# Patient Record
Sex: Male | Born: 1935 | Race: White | Hispanic: No | Marital: Married | Smoking: Former smoker
Health system: Southern US, Community
[De-identification: ages and names within clinical notes are randomized; demographics above are authoritative.]

## PROBLEM LIST (undated history)

## (undated) DIAGNOSIS — I10 Essential (primary) hypertension: Secondary | ICD-10-CM

## (undated) DIAGNOSIS — E119 Type 2 diabetes mellitus without complications: Secondary | ICD-10-CM

## (undated) DIAGNOSIS — N189 Chronic kidney disease, unspecified: Secondary | ICD-10-CM

## (undated) DIAGNOSIS — F0280 Dementia in other diseases classified elsewhere without behavioral disturbance: Secondary | ICD-10-CM

## (undated) DIAGNOSIS — G309 Alzheimer's disease, unspecified: Secondary | ICD-10-CM

## (undated) DIAGNOSIS — I2510 Atherosclerotic heart disease of native coronary artery without angina pectoris: Secondary | ICD-10-CM

## (undated) DIAGNOSIS — G459 Transient cerebral ischemic attack, unspecified: Secondary | ICD-10-CM

## (undated) HISTORY — PX: ANGIOPLASTY: SHX39

## (undated) HISTORY — DX: Dementia in other diseases classified elsewhere, unspecified severity, without behavioral disturbance, psychotic disturbance, mood disturbance, and anxiety: F02.80

## (undated) HISTORY — PX: ESOPHAGEAL DILATION: SHX303

---

## 2015-07-27 DIAGNOSIS — I517 Cardiomegaly: Secondary | ICD-10-CM | POA: Insufficient documentation

## 2015-07-27 DIAGNOSIS — K219 Gastro-esophageal reflux disease without esophagitis: Secondary | ICD-10-CM | POA: Insufficient documentation

## 2015-07-27 DIAGNOSIS — E782 Mixed hyperlipidemia: Secondary | ICD-10-CM | POA: Insufficient documentation

## 2015-07-29 DIAGNOSIS — E782 Mixed hyperlipidemia: Secondary | ICD-10-CM | POA: Diagnosis not present

## 2015-07-29 DIAGNOSIS — N401 Enlarged prostate with lower urinary tract symptoms: Secondary | ICD-10-CM | POA: Diagnosis not present

## 2015-07-29 DIAGNOSIS — N183 Chronic kidney disease, stage 3 (moderate): Secondary | ICD-10-CM | POA: Diagnosis not present

## 2015-07-29 DIAGNOSIS — F319 Bipolar disorder, unspecified: Secondary | ICD-10-CM | POA: Insufficient documentation

## 2015-07-29 DIAGNOSIS — E1122 Type 2 diabetes mellitus with diabetic chronic kidney disease: Secondary | ICD-10-CM | POA: Diagnosis not present

## 2015-07-29 DIAGNOSIS — F3130 Bipolar disorder, current episode depressed, mild or moderate severity, unspecified: Secondary | ICD-10-CM | POA: Diagnosis not present

## 2015-07-29 DIAGNOSIS — I129 Hypertensive chronic kidney disease with stage 1 through stage 4 chronic kidney disease, or unspecified chronic kidney disease: Secondary | ICD-10-CM | POA: Diagnosis not present

## 2015-08-01 DIAGNOSIS — E1122 Type 2 diabetes mellitus with diabetic chronic kidney disease: Secondary | ICD-10-CM | POA: Diagnosis not present

## 2015-08-01 DIAGNOSIS — E782 Mixed hyperlipidemia: Secondary | ICD-10-CM | POA: Diagnosis not present

## 2015-08-01 DIAGNOSIS — I10 Essential (primary) hypertension: Secondary | ICD-10-CM | POA: Diagnosis not present

## 2015-08-01 DIAGNOSIS — N183 Chronic kidney disease, stage 3 (moderate): Secondary | ICD-10-CM | POA: Diagnosis not present

## 2015-08-03 DIAGNOSIS — I129 Hypertensive chronic kidney disease with stage 1 through stage 4 chronic kidney disease, or unspecified chronic kidney disease: Secondary | ICD-10-CM | POA: Diagnosis not present

## 2015-08-03 DIAGNOSIS — G5601 Carpal tunnel syndrome, right upper limb: Secondary | ICD-10-CM | POA: Diagnosis not present

## 2015-08-03 DIAGNOSIS — E782 Mixed hyperlipidemia: Secondary | ICD-10-CM | POA: Diagnosis not present

## 2015-08-03 DIAGNOSIS — N183 Chronic kidney disease, stage 3 (moderate): Secondary | ICD-10-CM | POA: Diagnosis not present

## 2015-08-03 DIAGNOSIS — E1122 Type 2 diabetes mellitus with diabetic chronic kidney disease: Secondary | ICD-10-CM | POA: Diagnosis not present

## 2015-08-24 DIAGNOSIS — G5601 Carpal tunnel syndrome, right upper limb: Secondary | ICD-10-CM | POA: Diagnosis not present

## 2015-08-29 DIAGNOSIS — H2511 Age-related nuclear cataract, right eye: Secondary | ICD-10-CM | POA: Diagnosis not present

## 2015-08-29 DIAGNOSIS — H5203 Hypermetropia, bilateral: Secondary | ICD-10-CM | POA: Diagnosis not present

## 2015-08-29 DIAGNOSIS — D2311 Other benign neoplasm of skin of right eyelid, including canthus: Secondary | ICD-10-CM | POA: Diagnosis not present

## 2015-08-29 DIAGNOSIS — H353132 Nonexudative age-related macular degeneration, bilateral, intermediate dry stage: Secondary | ICD-10-CM | POA: Diagnosis not present

## 2015-09-14 DIAGNOSIS — G5603 Carpal tunnel syndrome, bilateral upper limbs: Secondary | ICD-10-CM | POA: Diagnosis not present

## 2015-10-10 DIAGNOSIS — G5603 Carpal tunnel syndrome, bilateral upper limbs: Secondary | ICD-10-CM | POA: Diagnosis not present

## 2015-10-31 DIAGNOSIS — C4491 Basal cell carcinoma of skin, unspecified: Secondary | ICD-10-CM | POA: Diagnosis not present

## 2015-10-31 DIAGNOSIS — J180 Bronchopneumonia, unspecified organism: Secondary | ICD-10-CM | POA: Diagnosis not present

## 2015-10-31 DIAGNOSIS — J209 Acute bronchitis, unspecified: Secondary | ICD-10-CM | POA: Diagnosis not present

## 2015-10-31 DIAGNOSIS — N183 Chronic kidney disease, stage 3 (moderate): Secondary | ICD-10-CM | POA: Diagnosis not present

## 2015-10-31 DIAGNOSIS — E1122 Type 2 diabetes mellitus with diabetic chronic kidney disease: Secondary | ICD-10-CM | POA: Diagnosis not present

## 2015-10-31 DIAGNOSIS — I129 Hypertensive chronic kidney disease with stage 1 through stage 4 chronic kidney disease, or unspecified chronic kidney disease: Secondary | ICD-10-CM | POA: Diagnosis not present

## 2015-10-31 DIAGNOSIS — R05 Cough: Secondary | ICD-10-CM | POA: Diagnosis not present

## 2015-11-01 DIAGNOSIS — J9811 Atelectasis: Secondary | ICD-10-CM | POA: Diagnosis not present

## 2015-11-15 DIAGNOSIS — Z85828 Personal history of other malignant neoplasm of skin: Secondary | ICD-10-CM | POA: Diagnosis not present

## 2015-11-15 DIAGNOSIS — L570 Actinic keratosis: Secondary | ICD-10-CM | POA: Diagnosis not present

## 2015-11-15 DIAGNOSIS — Z08 Encounter for follow-up examination after completed treatment for malignant neoplasm: Secondary | ICD-10-CM | POA: Diagnosis not present

## 2015-11-15 DIAGNOSIS — L821 Other seborrheic keratosis: Secondary | ICD-10-CM | POA: Diagnosis not present

## 2015-11-15 DIAGNOSIS — L578 Other skin changes due to chronic exposure to nonionizing radiation: Secondary | ICD-10-CM | POA: Diagnosis not present

## 2015-11-28 DIAGNOSIS — J209 Acute bronchitis, unspecified: Secondary | ICD-10-CM | POA: Diagnosis not present

## 2015-12-28 DIAGNOSIS — H353132 Nonexudative age-related macular degeneration, bilateral, intermediate dry stage: Secondary | ICD-10-CM | POA: Diagnosis not present

## 2015-12-28 DIAGNOSIS — H2511 Age-related nuclear cataract, right eye: Secondary | ICD-10-CM | POA: Diagnosis not present

## 2015-12-28 DIAGNOSIS — H5203 Hypermetropia, bilateral: Secondary | ICD-10-CM | POA: Diagnosis not present

## 2015-12-28 DIAGNOSIS — H01001 Unspecified blepharitis right upper eyelid: Secondary | ICD-10-CM | POA: Diagnosis not present

## 2015-12-28 DIAGNOSIS — E119 Type 2 diabetes mellitus without complications: Secondary | ICD-10-CM | POA: Diagnosis not present

## 2015-12-28 DIAGNOSIS — Z961 Presence of intraocular lens: Secondary | ICD-10-CM | POA: Diagnosis not present

## 2016-01-27 DIAGNOSIS — N183 Chronic kidney disease, stage 3 (moderate): Secondary | ICD-10-CM | POA: Diagnosis not present

## 2016-01-27 DIAGNOSIS — E1122 Type 2 diabetes mellitus with diabetic chronic kidney disease: Secondary | ICD-10-CM | POA: Diagnosis not present

## 2016-01-27 DIAGNOSIS — F3130 Bipolar disorder, current episode depressed, mild or moderate severity, unspecified: Secondary | ICD-10-CM | POA: Diagnosis not present

## 2016-01-27 DIAGNOSIS — I129 Hypertensive chronic kidney disease with stage 1 through stage 4 chronic kidney disease, or unspecified chronic kidney disease: Secondary | ICD-10-CM | POA: Diagnosis not present

## 2016-01-27 DIAGNOSIS — Z0000 Encounter for general adult medical examination without abnormal findings: Secondary | ICD-10-CM | POA: Diagnosis not present

## 2016-02-15 DIAGNOSIS — K649 Unspecified hemorrhoids: Secondary | ICD-10-CM | POA: Diagnosis not present

## 2016-02-15 DIAGNOSIS — Z1211 Encounter for screening for malignant neoplasm of colon: Secondary | ICD-10-CM | POA: Diagnosis not present

## 2016-02-15 DIAGNOSIS — K5730 Diverticulosis of large intestine without perforation or abscess without bleeding: Secondary | ICD-10-CM | POA: Diagnosis not present

## 2016-02-15 DIAGNOSIS — Z0000 Encounter for general adult medical examination without abnormal findings: Secondary | ICD-10-CM | POA: Diagnosis not present

## 2016-02-15 DIAGNOSIS — D122 Benign neoplasm of ascending colon: Secondary | ICD-10-CM | POA: Diagnosis not present

## 2016-02-15 DIAGNOSIS — Z86010 Personal history of colonic polyps: Secondary | ICD-10-CM | POA: Diagnosis not present

## 2016-02-22 DIAGNOSIS — L570 Actinic keratosis: Secondary | ICD-10-CM | POA: Diagnosis not present

## 2016-05-17 DIAGNOSIS — L89159 Pressure ulcer of sacral region, unspecified stage: Secondary | ICD-10-CM | POA: Diagnosis not present

## 2016-05-17 DIAGNOSIS — H61001 Unspecified perichondritis of right external ear: Secondary | ICD-10-CM | POA: Diagnosis not present

## 2016-05-17 DIAGNOSIS — D485 Neoplasm of uncertain behavior of skin: Secondary | ICD-10-CM | POA: Diagnosis not present

## 2016-05-22 DIAGNOSIS — H524 Presbyopia: Secondary | ICD-10-CM | POA: Diagnosis not present

## 2016-05-22 DIAGNOSIS — H52203 Unspecified astigmatism, bilateral: Secondary | ICD-10-CM | POA: Diagnosis not present

## 2016-05-22 DIAGNOSIS — H2511 Age-related nuclear cataract, right eye: Secondary | ICD-10-CM | POA: Diagnosis not present

## 2016-05-22 DIAGNOSIS — H353132 Nonexudative age-related macular degeneration, bilateral, intermediate dry stage: Secondary | ICD-10-CM | POA: Diagnosis not present

## 2016-06-19 DIAGNOSIS — L308 Other specified dermatitis: Secondary | ICD-10-CM | POA: Diagnosis not present

## 2016-07-25 DIAGNOSIS — E782 Mixed hyperlipidemia: Secondary | ICD-10-CM | POA: Diagnosis not present

## 2016-07-25 DIAGNOSIS — N183 Chronic kidney disease, stage 3 (moderate): Secondary | ICD-10-CM | POA: Diagnosis not present

## 2016-07-25 DIAGNOSIS — E1122 Type 2 diabetes mellitus with diabetic chronic kidney disease: Secondary | ICD-10-CM | POA: Diagnosis not present

## 2016-07-25 DIAGNOSIS — F3130 Bipolar disorder, current episode depressed, mild or moderate severity, unspecified: Secondary | ICD-10-CM | POA: Diagnosis not present

## 2016-07-25 DIAGNOSIS — I129 Hypertensive chronic kidney disease with stage 1 through stage 4 chronic kidney disease, or unspecified chronic kidney disease: Secondary | ICD-10-CM | POA: Diagnosis not present

## 2016-08-07 DIAGNOSIS — N183 Chronic kidney disease, stage 3 (moderate): Secondary | ICD-10-CM | POA: Diagnosis not present

## 2016-08-07 DIAGNOSIS — E782 Mixed hyperlipidemia: Secondary | ICD-10-CM | POA: Diagnosis not present

## 2016-08-07 DIAGNOSIS — E1122 Type 2 diabetes mellitus with diabetic chronic kidney disease: Secondary | ICD-10-CM | POA: Diagnosis not present

## 2016-08-20 DIAGNOSIS — J209 Acute bronchitis, unspecified: Secondary | ICD-10-CM | POA: Diagnosis not present

## 2016-10-11 DIAGNOSIS — L309 Dermatitis, unspecified: Secondary | ICD-10-CM | POA: Diagnosis not present

## 2016-10-24 DIAGNOSIS — G5601 Carpal tunnel syndrome, right upper limb: Secondary | ICD-10-CM | POA: Diagnosis not present

## 2016-10-25 DIAGNOSIS — L309 Dermatitis, unspecified: Secondary | ICD-10-CM | POA: Diagnosis not present

## 2016-11-06 DIAGNOSIS — Z85828 Personal history of other malignant neoplasm of skin: Secondary | ICD-10-CM | POA: Diagnosis not present

## 2016-11-06 DIAGNOSIS — Z08 Encounter for follow-up examination after completed treatment for malignant neoplasm: Secondary | ICD-10-CM | POA: Diagnosis not present

## 2016-11-06 DIAGNOSIS — L570 Actinic keratosis: Secondary | ICD-10-CM | POA: Diagnosis not present

## 2016-11-06 DIAGNOSIS — H61001 Unspecified perichondritis of right external ear: Secondary | ICD-10-CM | POA: Diagnosis not present

## 2016-11-06 DIAGNOSIS — L89159 Pressure ulcer of sacral region, unspecified stage: Secondary | ICD-10-CM | POA: Diagnosis not present

## 2016-11-26 DIAGNOSIS — E119 Type 2 diabetes mellitus without complications: Secondary | ICD-10-CM | POA: Diagnosis not present

## 2016-11-26 DIAGNOSIS — Z961 Presence of intraocular lens: Secondary | ICD-10-CM | POA: Diagnosis not present

## 2016-11-26 DIAGNOSIS — H52203 Unspecified astigmatism, bilateral: Secondary | ICD-10-CM | POA: Diagnosis not present

## 2016-11-26 DIAGNOSIS — H353132 Nonexudative age-related macular degeneration, bilateral, intermediate dry stage: Secondary | ICD-10-CM | POA: Diagnosis not present

## 2016-11-26 DIAGNOSIS — H2511 Age-related nuclear cataract, right eye: Secondary | ICD-10-CM | POA: Diagnosis not present

## 2016-11-26 DIAGNOSIS — H524 Presbyopia: Secondary | ICD-10-CM | POA: Diagnosis not present

## 2017-01-24 DIAGNOSIS — N183 Chronic kidney disease, stage 3 (moderate): Secondary | ICD-10-CM | POA: Diagnosis not present

## 2017-01-24 DIAGNOSIS — I129 Hypertensive chronic kidney disease with stage 1 through stage 4 chronic kidney disease, or unspecified chronic kidney disease: Secondary | ICD-10-CM | POA: Diagnosis not present

## 2017-01-24 DIAGNOSIS — E1122 Type 2 diabetes mellitus with diabetic chronic kidney disease: Secondary | ICD-10-CM | POA: Diagnosis not present

## 2017-01-24 DIAGNOSIS — L570 Actinic keratosis: Secondary | ICD-10-CM | POA: Diagnosis not present

## 2017-02-07 DIAGNOSIS — E1122 Type 2 diabetes mellitus with diabetic chronic kidney disease: Secondary | ICD-10-CM | POA: Diagnosis not present

## 2017-02-07 DIAGNOSIS — N183 Chronic kidney disease, stage 3 (moderate): Secondary | ICD-10-CM | POA: Diagnosis not present

## 2017-02-07 DIAGNOSIS — I129 Hypertensive chronic kidney disease with stage 1 through stage 4 chronic kidney disease, or unspecified chronic kidney disease: Secondary | ICD-10-CM | POA: Diagnosis not present

## 2017-02-07 DIAGNOSIS — N281 Cyst of kidney, acquired: Secondary | ICD-10-CM | POA: Diagnosis not present

## 2017-03-01 DIAGNOSIS — L570 Actinic keratosis: Secondary | ICD-10-CM | POA: Diagnosis not present

## 2017-04-05 DIAGNOSIS — L989 Disorder of the skin and subcutaneous tissue, unspecified: Secondary | ICD-10-CM | POA: Diagnosis not present

## 2017-04-05 DIAGNOSIS — L570 Actinic keratosis: Secondary | ICD-10-CM | POA: Diagnosis not present

## 2017-04-26 DIAGNOSIS — N183 Chronic kidney disease, stage 3 (moderate): Secondary | ICD-10-CM | POA: Diagnosis not present

## 2017-04-26 DIAGNOSIS — L570 Actinic keratosis: Secondary | ICD-10-CM | POA: Diagnosis not present

## 2017-04-26 DIAGNOSIS — E1122 Type 2 diabetes mellitus with diabetic chronic kidney disease: Secondary | ICD-10-CM | POA: Diagnosis not present

## 2017-05-29 DIAGNOSIS — R1310 Dysphagia, unspecified: Secondary | ICD-10-CM | POA: Diagnosis not present

## 2017-06-04 DIAGNOSIS — K2210 Ulcer of esophagus without bleeding: Secondary | ICD-10-CM | POA: Diagnosis not present

## 2017-06-04 DIAGNOSIS — K2270 Barrett's esophagus without dysplasia: Secondary | ICD-10-CM | POA: Diagnosis not present

## 2017-06-04 DIAGNOSIS — K222 Esophageal obstruction: Secondary | ICD-10-CM | POA: Diagnosis not present

## 2017-06-04 DIAGNOSIS — K228 Other specified diseases of esophagus: Secondary | ICD-10-CM | POA: Diagnosis not present

## 2017-06-04 DIAGNOSIS — K210 Gastro-esophageal reflux disease with esophagitis: Secondary | ICD-10-CM | POA: Diagnosis not present

## 2017-06-04 DIAGNOSIS — K225 Diverticulum of esophagus, acquired: Secondary | ICD-10-CM | POA: Diagnosis not present

## 2017-08-29 DIAGNOSIS — L578 Other skin changes due to chronic exposure to nonionizing radiation: Secondary | ICD-10-CM | POA: Diagnosis not present

## 2017-08-29 DIAGNOSIS — C44222 Squamous cell carcinoma of skin of right ear and external auricular canal: Secondary | ICD-10-CM | POA: Diagnosis not present

## 2017-08-29 DIAGNOSIS — C44519 Basal cell carcinoma of skin of other part of trunk: Secondary | ICD-10-CM | POA: Diagnosis not present

## 2017-08-29 DIAGNOSIS — L0100 Impetigo, unspecified: Secondary | ICD-10-CM | POA: Diagnosis not present

## 2017-10-01 DIAGNOSIS — G5601 Carpal tunnel syndrome, right upper limb: Secondary | ICD-10-CM | POA: Diagnosis not present

## 2017-10-30 DIAGNOSIS — N401 Enlarged prostate with lower urinary tract symptoms: Secondary | ICD-10-CM | POA: Insufficient documentation

## 2017-10-30 DIAGNOSIS — E1122 Type 2 diabetes mellitus with diabetic chronic kidney disease: Secondary | ICD-10-CM | POA: Diagnosis not present

## 2017-10-30 DIAGNOSIS — I129 Hypertensive chronic kidney disease with stage 1 through stage 4 chronic kidney disease, or unspecified chronic kidney disease: Secondary | ICD-10-CM | POA: Diagnosis not present

## 2017-10-30 DIAGNOSIS — E782 Mixed hyperlipidemia: Secondary | ICD-10-CM | POA: Diagnosis not present

## 2017-10-30 DIAGNOSIS — N183 Chronic kidney disease, stage 3 (moderate): Secondary | ICD-10-CM | POA: Diagnosis not present

## 2017-11-06 DIAGNOSIS — I129 Hypertensive chronic kidney disease with stage 1 through stage 4 chronic kidney disease, or unspecified chronic kidney disease: Secondary | ICD-10-CM | POA: Diagnosis not present

## 2017-11-06 DIAGNOSIS — N179 Acute kidney failure, unspecified: Secondary | ICD-10-CM | POA: Diagnosis not present

## 2017-11-06 DIAGNOSIS — E1122 Type 2 diabetes mellitus with diabetic chronic kidney disease: Secondary | ICD-10-CM | POA: Diagnosis not present

## 2017-11-06 DIAGNOSIS — N183 Chronic kidney disease, stage 3 (moderate): Secondary | ICD-10-CM | POA: Diagnosis not present

## 2018-01-04 DIAGNOSIS — N179 Acute kidney failure, unspecified: Secondary | ICD-10-CM | POA: Diagnosis not present

## 2018-01-04 DIAGNOSIS — N183 Chronic kidney disease, stage 3 (moderate): Secondary | ICD-10-CM | POA: Diagnosis not present

## 2018-01-07 DIAGNOSIS — I129 Hypertensive chronic kidney disease with stage 1 through stage 4 chronic kidney disease, or unspecified chronic kidney disease: Secondary | ICD-10-CM | POA: Diagnosis not present

## 2018-01-07 DIAGNOSIS — N179 Acute kidney failure, unspecified: Secondary | ICD-10-CM | POA: Diagnosis not present

## 2018-01-07 DIAGNOSIS — N183 Chronic kidney disease, stage 3 (moderate): Secondary | ICD-10-CM | POA: Diagnosis not present

## 2018-01-07 DIAGNOSIS — E1122 Type 2 diabetes mellitus with diabetic chronic kidney disease: Secondary | ICD-10-CM | POA: Diagnosis not present

## 2018-03-26 DIAGNOSIS — L570 Actinic keratosis: Secondary | ICD-10-CM | POA: Diagnosis not present

## 2018-03-26 DIAGNOSIS — L280 Lichen simplex chronicus: Secondary | ICD-10-CM | POA: Diagnosis not present

## 2018-03-26 DIAGNOSIS — H61001 Unspecified perichondritis of right external ear: Secondary | ICD-10-CM | POA: Diagnosis not present

## 2018-04-23 DIAGNOSIS — H61001 Unspecified perichondritis of right external ear: Secondary | ICD-10-CM | POA: Diagnosis not present

## 2018-04-25 DIAGNOSIS — G5601 Carpal tunnel syndrome, right upper limb: Secondary | ICD-10-CM | POA: Diagnosis not present

## 2018-04-30 DIAGNOSIS — E1122 Type 2 diabetes mellitus with diabetic chronic kidney disease: Secondary | ICD-10-CM | POA: Diagnosis not present

## 2018-04-30 DIAGNOSIS — I129 Hypertensive chronic kidney disease with stage 1 through stage 4 chronic kidney disease, or unspecified chronic kidney disease: Secondary | ICD-10-CM | POA: Diagnosis not present

## 2018-04-30 DIAGNOSIS — N183 Chronic kidney disease, stage 3 (moderate): Secondary | ICD-10-CM | POA: Diagnosis not present

## 2018-05-05 DIAGNOSIS — N183 Chronic kidney disease, stage 3 (moderate): Secondary | ICD-10-CM | POA: Diagnosis not present

## 2018-05-05 DIAGNOSIS — Z0000 Encounter for general adult medical examination without abnormal findings: Secondary | ICD-10-CM | POA: Diagnosis not present

## 2018-05-05 DIAGNOSIS — L89812 Pressure ulcer of head, stage 2: Secondary | ICD-10-CM | POA: Diagnosis not present

## 2018-05-05 DIAGNOSIS — E782 Mixed hyperlipidemia: Secondary | ICD-10-CM | POA: Diagnosis not present

## 2018-05-05 DIAGNOSIS — E1122 Type 2 diabetes mellitus with diabetic chronic kidney disease: Secondary | ICD-10-CM | POA: Diagnosis not present

## 2018-05-05 DIAGNOSIS — Z23 Encounter for immunization: Secondary | ICD-10-CM | POA: Diagnosis not present

## 2018-05-05 DIAGNOSIS — Z7984 Long term (current) use of oral hypoglycemic drugs: Secondary | ICD-10-CM | POA: Diagnosis not present

## 2018-05-05 DIAGNOSIS — F319 Bipolar disorder, unspecified: Secondary | ICD-10-CM | POA: Diagnosis not present

## 2018-05-05 DIAGNOSIS — I129 Hypertensive chronic kidney disease with stage 1 through stage 4 chronic kidney disease, or unspecified chronic kidney disease: Secondary | ICD-10-CM | POA: Diagnosis not present

## 2018-05-14 DIAGNOSIS — H61001 Unspecified perichondritis of right external ear: Secondary | ICD-10-CM | POA: Diagnosis not present

## 2018-10-09 DIAGNOSIS — E1122 Type 2 diabetes mellitus with diabetic chronic kidney disease: Secondary | ICD-10-CM | POA: Diagnosis not present

## 2018-10-09 DIAGNOSIS — N183 Chronic kidney disease, stage 3 (moderate): Secondary | ICD-10-CM | POA: Diagnosis not present

## 2018-10-09 DIAGNOSIS — G5603 Carpal tunnel syndrome, bilateral upper limbs: Secondary | ICD-10-CM | POA: Diagnosis not present

## 2018-10-09 DIAGNOSIS — Z87891 Personal history of nicotine dependence: Secondary | ICD-10-CM | POA: Diagnosis not present

## 2018-10-09 DIAGNOSIS — I129 Hypertensive chronic kidney disease with stage 1 through stage 4 chronic kidney disease, or unspecified chronic kidney disease: Secondary | ICD-10-CM | POA: Diagnosis not present

## 2018-10-09 DIAGNOSIS — M7052 Other bursitis of knee, left knee: Secondary | ICD-10-CM | POA: Diagnosis not present

## 2018-11-07 DIAGNOSIS — G5601 Carpal tunnel syndrome, right upper limb: Secondary | ICD-10-CM | POA: Diagnosis not present

## 2018-11-17 DIAGNOSIS — H60392 Other infective otitis externa, left ear: Secondary | ICD-10-CM | POA: Diagnosis not present

## 2018-11-17 DIAGNOSIS — F319 Bipolar disorder, unspecified: Secondary | ICD-10-CM | POA: Diagnosis not present

## 2018-11-17 DIAGNOSIS — H9202 Otalgia, left ear: Secondary | ICD-10-CM | POA: Diagnosis not present

## 2018-11-24 DIAGNOSIS — H60312 Diffuse otitis externa, left ear: Secondary | ICD-10-CM | POA: Diagnosis not present

## 2018-11-24 DIAGNOSIS — Z87891 Personal history of nicotine dependence: Secondary | ICD-10-CM | POA: Diagnosis not present

## 2018-11-24 DIAGNOSIS — Z974 Presence of external hearing-aid: Secondary | ICD-10-CM | POA: Diagnosis not present

## 2018-11-24 DIAGNOSIS — Z7289 Other problems related to lifestyle: Secondary | ICD-10-CM | POA: Diagnosis not present

## 2018-11-24 DIAGNOSIS — T162XXA Foreign body in left ear, initial encounter: Secondary | ICD-10-CM | POA: Diagnosis not present

## 2019-01-06 DIAGNOSIS — Z0000 Encounter for general adult medical examination without abnormal findings: Secondary | ICD-10-CM | POA: Diagnosis not present

## 2019-01-06 DIAGNOSIS — E1122 Type 2 diabetes mellitus with diabetic chronic kidney disease: Secondary | ICD-10-CM | POA: Diagnosis not present

## 2019-01-06 DIAGNOSIS — N183 Chronic kidney disease, stage 3 (moderate): Secondary | ICD-10-CM | POA: Diagnosis not present

## 2019-01-08 DIAGNOSIS — N183 Chronic kidney disease, stage 3 (moderate): Secondary | ICD-10-CM | POA: Diagnosis not present

## 2019-01-08 DIAGNOSIS — L309 Dermatitis, unspecified: Secondary | ICD-10-CM | POA: Diagnosis not present

## 2019-01-08 DIAGNOSIS — I129 Hypertensive chronic kidney disease with stage 1 through stage 4 chronic kidney disease, or unspecified chronic kidney disease: Secondary | ICD-10-CM | POA: Diagnosis not present

## 2019-01-08 DIAGNOSIS — E1122 Type 2 diabetes mellitus with diabetic chronic kidney disease: Secondary | ICD-10-CM | POA: Diagnosis not present

## 2019-03-24 DIAGNOSIS — E1122 Type 2 diabetes mellitus with diabetic chronic kidney disease: Secondary | ICD-10-CM | POA: Diagnosis not present

## 2019-03-24 DIAGNOSIS — N183 Chronic kidney disease, stage 3 (moderate): Secondary | ICD-10-CM | POA: Diagnosis not present

## 2019-03-24 DIAGNOSIS — I129 Hypertensive chronic kidney disease with stage 1 through stage 4 chronic kidney disease, or unspecified chronic kidney disease: Secondary | ICD-10-CM | POA: Diagnosis not present

## 2019-03-24 DIAGNOSIS — Z87891 Personal history of nicotine dependence: Secondary | ICD-10-CM | POA: Diagnosis not present

## 2019-03-24 DIAGNOSIS — E782 Mixed hyperlipidemia: Secondary | ICD-10-CM | POA: Diagnosis not present

## 2019-03-24 DIAGNOSIS — Z0000 Encounter for general adult medical examination without abnormal findings: Secondary | ICD-10-CM | POA: Diagnosis not present

## 2019-03-24 DIAGNOSIS — N401 Enlarged prostate with lower urinary tract symptoms: Secondary | ICD-10-CM | POA: Diagnosis not present

## 2019-04-06 DIAGNOSIS — Z23 Encounter for immunization: Secondary | ICD-10-CM | POA: Diagnosis not present

## 2019-04-06 DIAGNOSIS — E1122 Type 2 diabetes mellitus with diabetic chronic kidney disease: Secondary | ICD-10-CM | POA: Diagnosis not present

## 2019-04-06 DIAGNOSIS — J069 Acute upper respiratory infection, unspecified: Secondary | ICD-10-CM | POA: Diagnosis not present

## 2019-04-06 DIAGNOSIS — U071 COVID-19: Secondary | ICD-10-CM | POA: Diagnosis not present

## 2019-04-06 DIAGNOSIS — N183 Chronic kidney disease, stage 3 (moderate): Secondary | ICD-10-CM | POA: Diagnosis not present

## 2019-05-25 DIAGNOSIS — G5601 Carpal tunnel syndrome, right upper limb: Secondary | ICD-10-CM | POA: Diagnosis not present

## 2019-06-12 DIAGNOSIS — H61001 Unspecified perichondritis of right external ear: Secondary | ICD-10-CM | POA: Diagnosis not present

## 2019-06-12 DIAGNOSIS — L309 Dermatitis, unspecified: Secondary | ICD-10-CM | POA: Diagnosis not present

## 2019-06-15 DIAGNOSIS — G5603 Carpal tunnel syndrome, bilateral upper limbs: Secondary | ICD-10-CM | POA: Diagnosis not present

## 2019-07-17 DIAGNOSIS — G5603 Carpal tunnel syndrome, bilateral upper limbs: Secondary | ICD-10-CM | POA: Diagnosis not present

## 2019-07-29 DIAGNOSIS — L280 Lichen simplex chronicus: Secondary | ICD-10-CM | POA: Diagnosis not present

## 2019-07-29 DIAGNOSIS — L0100 Impetigo, unspecified: Secondary | ICD-10-CM | POA: Diagnosis not present

## 2019-08-14 DIAGNOSIS — R200 Anesthesia of skin: Secondary | ICD-10-CM | POA: Diagnosis not present

## 2019-08-14 DIAGNOSIS — G5601 Carpal tunnel syndrome, right upper limb: Secondary | ICD-10-CM | POA: Diagnosis not present

## 2019-08-31 DIAGNOSIS — G5601 Carpal tunnel syndrome, right upper limb: Secondary | ICD-10-CM | POA: Diagnosis not present

## 2019-09-07 DIAGNOSIS — N183 Chronic kidney disease, stage 3 unspecified: Secondary | ICD-10-CM | POA: Diagnosis not present

## 2019-09-07 DIAGNOSIS — Z20822 Contact with and (suspected) exposure to covid-19: Secondary | ICD-10-CM | POA: Diagnosis not present

## 2019-09-07 DIAGNOSIS — I129 Hypertensive chronic kidney disease with stage 1 through stage 4 chronic kidney disease, or unspecified chronic kidney disease: Secondary | ICD-10-CM | POA: Diagnosis not present

## 2019-09-07 DIAGNOSIS — E1122 Type 2 diabetes mellitus with diabetic chronic kidney disease: Secondary | ICD-10-CM | POA: Diagnosis not present

## 2019-09-07 DIAGNOSIS — G5601 Carpal tunnel syndrome, right upper limb: Secondary | ICD-10-CM | POA: Diagnosis not present

## 2019-09-07 DIAGNOSIS — I2510 Atherosclerotic heart disease of native coronary artery without angina pectoris: Secondary | ICD-10-CM | POA: Diagnosis not present

## 2019-09-07 DIAGNOSIS — Z01812 Encounter for preprocedural laboratory examination: Secondary | ICD-10-CM | POA: Diagnosis not present

## 2019-09-10 DIAGNOSIS — G5601 Carpal tunnel syndrome, right upper limb: Secondary | ICD-10-CM | POA: Diagnosis not present

## 2019-09-21 DIAGNOSIS — N183 Chronic kidney disease, stage 3 unspecified: Secondary | ICD-10-CM | POA: Diagnosis not present

## 2019-09-21 DIAGNOSIS — I129 Hypertensive chronic kidney disease with stage 1 through stage 4 chronic kidney disease, or unspecified chronic kidney disease: Secondary | ICD-10-CM | POA: Diagnosis not present

## 2019-09-21 DIAGNOSIS — R3912 Poor urinary stream: Secondary | ICD-10-CM | POA: Diagnosis not present

## 2019-09-21 DIAGNOSIS — N401 Enlarged prostate with lower urinary tract symptoms: Secondary | ICD-10-CM | POA: Diagnosis not present

## 2019-09-21 DIAGNOSIS — F319 Bipolar disorder, unspecified: Secondary | ICD-10-CM | POA: Diagnosis not present

## 2019-09-21 DIAGNOSIS — G5603 Carpal tunnel syndrome, bilateral upper limbs: Secondary | ICD-10-CM | POA: Diagnosis not present

## 2019-09-21 DIAGNOSIS — E782 Mixed hyperlipidemia: Secondary | ICD-10-CM | POA: Diagnosis not present

## 2019-09-21 DIAGNOSIS — E1122 Type 2 diabetes mellitus with diabetic chronic kidney disease: Secondary | ICD-10-CM | POA: Diagnosis not present

## 2019-10-06 DIAGNOSIS — E1122 Type 2 diabetes mellitus with diabetic chronic kidney disease: Secondary | ICD-10-CM | POA: Diagnosis not present

## 2019-10-06 DIAGNOSIS — I129 Hypertensive chronic kidney disease with stage 1 through stage 4 chronic kidney disease, or unspecified chronic kidney disease: Secondary | ICD-10-CM | POA: Diagnosis not present

## 2019-10-06 DIAGNOSIS — Z8616 Personal history of COVID-19: Secondary | ICD-10-CM | POA: Diagnosis not present

## 2019-10-06 DIAGNOSIS — N1832 Chronic kidney disease, stage 3b: Secondary | ICD-10-CM | POA: Diagnosis not present

## 2019-10-06 DIAGNOSIS — N261 Atrophy of kidney (terminal): Secondary | ICD-10-CM | POA: Diagnosis not present

## 2019-10-06 DIAGNOSIS — N179 Acute kidney failure, unspecified: Secondary | ICD-10-CM | POA: Diagnosis not present

## 2019-10-06 DIAGNOSIS — N183 Chronic kidney disease, stage 3 unspecified: Secondary | ICD-10-CM | POA: Diagnosis not present

## 2019-10-23 DIAGNOSIS — R55 Syncope and collapse: Secondary | ICD-10-CM | POA: Diagnosis not present

## 2019-10-23 DIAGNOSIS — R42 Dizziness and giddiness: Secondary | ICD-10-CM | POA: Diagnosis not present

## 2019-10-23 DIAGNOSIS — R4020 Unspecified coma: Secondary | ICD-10-CM | POA: Diagnosis not present

## 2019-10-23 DIAGNOSIS — I440 Atrioventricular block, first degree: Secondary | ICD-10-CM | POA: Diagnosis not present

## 2019-10-23 DIAGNOSIS — Z87891 Personal history of nicotine dependence: Secondary | ICD-10-CM | POA: Diagnosis not present

## 2019-10-23 DIAGNOSIS — Z87448 Personal history of other diseases of urinary system: Secondary | ICD-10-CM | POA: Diagnosis not present

## 2019-10-23 DIAGNOSIS — I493 Ventricular premature depolarization: Secondary | ICD-10-CM | POA: Diagnosis not present

## 2019-10-23 DIAGNOSIS — Z8679 Personal history of other diseases of the circulatory system: Secondary | ICD-10-CM | POA: Diagnosis not present

## 2019-10-23 DIAGNOSIS — Z8639 Personal history of other endocrine, nutritional and metabolic disease: Secondary | ICD-10-CM | POA: Diagnosis not present

## 2019-10-23 DIAGNOSIS — R001 Bradycardia, unspecified: Secondary | ICD-10-CM | POA: Diagnosis not present

## 2019-10-30 DIAGNOSIS — R42 Dizziness and giddiness: Secondary | ICD-10-CM | POA: Diagnosis not present

## 2019-10-30 DIAGNOSIS — R55 Syncope and collapse: Secondary | ICD-10-CM | POA: Diagnosis not present

## 2019-10-30 DIAGNOSIS — I129 Hypertensive chronic kidney disease with stage 1 through stage 4 chronic kidney disease, or unspecified chronic kidney disease: Secondary | ICD-10-CM | POA: Diagnosis not present

## 2019-10-30 DIAGNOSIS — N183 Chronic kidney disease, stage 3 unspecified: Secondary | ICD-10-CM | POA: Diagnosis not present

## 2019-10-30 DIAGNOSIS — E1122 Type 2 diabetes mellitus with diabetic chronic kidney disease: Secondary | ICD-10-CM | POA: Diagnosis not present

## 2019-11-20 DIAGNOSIS — R55 Syncope and collapse: Secondary | ICD-10-CM | POA: Diagnosis not present

## 2019-11-20 DIAGNOSIS — I129 Hypertensive chronic kidney disease with stage 1 through stage 4 chronic kidney disease, or unspecified chronic kidney disease: Secondary | ICD-10-CM | POA: Diagnosis not present

## 2019-11-20 DIAGNOSIS — N183 Chronic kidney disease, stage 3 unspecified: Secondary | ICD-10-CM | POA: Diagnosis not present

## 2019-11-20 DIAGNOSIS — R42 Dizziness and giddiness: Secondary | ICD-10-CM | POA: Diagnosis not present

## 2019-11-20 DIAGNOSIS — E1122 Type 2 diabetes mellitus with diabetic chronic kidney disease: Secondary | ICD-10-CM | POA: Diagnosis not present

## 2019-12-10 DIAGNOSIS — N1832 Chronic kidney disease, stage 3b: Secondary | ICD-10-CM | POA: Diagnosis not present

## 2019-12-15 DIAGNOSIS — N261 Atrophy of kidney (terminal): Secondary | ICD-10-CM | POA: Diagnosis not present

## 2019-12-15 DIAGNOSIS — E1122 Type 2 diabetes mellitus with diabetic chronic kidney disease: Secondary | ICD-10-CM | POA: Diagnosis not present

## 2019-12-15 DIAGNOSIS — N183 Chronic kidney disease, stage 3 unspecified: Secondary | ICD-10-CM | POA: Diagnosis not present

## 2019-12-15 DIAGNOSIS — N179 Acute kidney failure, unspecified: Secondary | ICD-10-CM | POA: Diagnosis not present

## 2019-12-15 DIAGNOSIS — I129 Hypertensive chronic kidney disease with stage 1 through stage 4 chronic kidney disease, or unspecified chronic kidney disease: Secondary | ICD-10-CM | POA: Diagnosis not present

## 2019-12-15 DIAGNOSIS — N1832 Chronic kidney disease, stage 3b: Secondary | ICD-10-CM | POA: Diagnosis not present

## 2019-12-18 DIAGNOSIS — I129 Hypertensive chronic kidney disease with stage 1 through stage 4 chronic kidney disease, or unspecified chronic kidney disease: Secondary | ICD-10-CM | POA: Diagnosis not present

## 2019-12-18 DIAGNOSIS — I517 Cardiomegaly: Secondary | ICD-10-CM | POA: Diagnosis not present

## 2019-12-18 DIAGNOSIS — N183 Chronic kidney disease, stage 3 unspecified: Secondary | ICD-10-CM | POA: Diagnosis not present

## 2019-12-18 DIAGNOSIS — R001 Bradycardia, unspecified: Secondary | ICD-10-CM | POA: Insufficient documentation

## 2019-12-18 DIAGNOSIS — R55 Syncope and collapse: Secondary | ICD-10-CM | POA: Insufficient documentation

## 2019-12-18 DIAGNOSIS — N1832 Chronic kidney disease, stage 3b: Secondary | ICD-10-CM | POA: Diagnosis not present

## 2019-12-30 DIAGNOSIS — N183 Chronic kidney disease, stage 3 unspecified: Secondary | ICD-10-CM | POA: Diagnosis not present

## 2019-12-30 DIAGNOSIS — I129 Hypertensive chronic kidney disease with stage 1 through stage 4 chronic kidney disease, or unspecified chronic kidney disease: Secondary | ICD-10-CM | POA: Diagnosis not present

## 2019-12-30 DIAGNOSIS — E1122 Type 2 diabetes mellitus with diabetic chronic kidney disease: Secondary | ICD-10-CM | POA: Diagnosis not present

## 2019-12-30 DIAGNOSIS — E782 Mixed hyperlipidemia: Secondary | ICD-10-CM | POA: Diagnosis not present

## 2019-12-30 DIAGNOSIS — N1832 Chronic kidney disease, stage 3b: Secondary | ICD-10-CM | POA: Diagnosis not present

## 2020-01-01 DIAGNOSIS — I129 Hypertensive chronic kidney disease with stage 1 through stage 4 chronic kidney disease, or unspecified chronic kidney disease: Secondary | ICD-10-CM | POA: Diagnosis not present

## 2020-01-01 DIAGNOSIS — L309 Dermatitis, unspecified: Secondary | ICD-10-CM | POA: Diagnosis not present

## 2020-01-01 DIAGNOSIS — E782 Mixed hyperlipidemia: Secondary | ICD-10-CM | POA: Diagnosis not present

## 2020-01-01 DIAGNOSIS — N183 Chronic kidney disease, stage 3 unspecified: Secondary | ICD-10-CM | POA: Diagnosis not present

## 2020-01-01 DIAGNOSIS — E1122 Type 2 diabetes mellitus with diabetic chronic kidney disease: Secondary | ICD-10-CM | POA: Diagnosis not present

## 2020-01-08 DIAGNOSIS — R55 Syncope and collapse: Secondary | ICD-10-CM | POA: Diagnosis not present

## 2020-01-08 DIAGNOSIS — I7789 Other specified disorders of arteries and arterioles: Secondary | ICD-10-CM | POA: Diagnosis not present

## 2020-01-08 DIAGNOSIS — I771 Stricture of artery: Secondary | ICD-10-CM | POA: Diagnosis not present

## 2020-01-08 DIAGNOSIS — I739 Peripheral vascular disease, unspecified: Secondary | ICD-10-CM | POA: Diagnosis not present

## 2020-01-13 DIAGNOSIS — I350 Nonrheumatic aortic (valve) stenosis: Secondary | ICD-10-CM | POA: Diagnosis not present

## 2020-01-13 DIAGNOSIS — I358 Other nonrheumatic aortic valve disorders: Secondary | ICD-10-CM | POA: Diagnosis not present

## 2020-01-22 DIAGNOSIS — R001 Bradycardia, unspecified: Secondary | ICD-10-CM | POA: Diagnosis not present

## 2020-01-22 DIAGNOSIS — R55 Syncope and collapse: Secondary | ICD-10-CM | POA: Diagnosis not present

## 2020-01-26 DIAGNOSIS — I493 Ventricular premature depolarization: Secondary | ICD-10-CM | POA: Diagnosis not present

## 2020-01-26 DIAGNOSIS — I471 Supraventricular tachycardia: Secondary | ICD-10-CM | POA: Diagnosis not present

## 2020-01-26 DIAGNOSIS — I441 Atrioventricular block, second degree: Secondary | ICD-10-CM | POA: Diagnosis not present

## 2020-02-02 DIAGNOSIS — R001 Bradycardia, unspecified: Secondary | ICD-10-CM | POA: Diagnosis not present

## 2020-02-02 DIAGNOSIS — N183 Chronic kidney disease, stage 3 unspecified: Secondary | ICD-10-CM | POA: Diagnosis not present

## 2020-02-02 DIAGNOSIS — E1122 Type 2 diabetes mellitus with diabetic chronic kidney disease: Secondary | ICD-10-CM | POA: Diagnosis not present

## 2020-02-24 DIAGNOSIS — H2511 Age-related nuclear cataract, right eye: Secondary | ICD-10-CM | POA: Diagnosis not present

## 2020-02-24 DIAGNOSIS — Z961 Presence of intraocular lens: Secondary | ICD-10-CM | POA: Diagnosis not present

## 2020-02-24 DIAGNOSIS — I10 Essential (primary) hypertension: Secondary | ICD-10-CM | POA: Diagnosis not present

## 2020-02-24 DIAGNOSIS — H353231 Exudative age-related macular degeneration, bilateral, with active choroidal neovascularization: Secondary | ICD-10-CM | POA: Diagnosis not present

## 2020-02-26 DIAGNOSIS — H353221 Exudative age-related macular degeneration, left eye, with active choroidal neovascularization: Secondary | ICD-10-CM | POA: Diagnosis not present

## 2020-02-26 DIAGNOSIS — H35373 Puckering of macula, bilateral: Secondary | ICD-10-CM | POA: Diagnosis not present

## 2020-02-26 DIAGNOSIS — D3132 Benign neoplasm of left choroid: Secondary | ICD-10-CM | POA: Diagnosis not present

## 2020-02-26 DIAGNOSIS — H353113 Nonexudative age-related macular degeneration, right eye, advanced atrophic without subfoveal involvement: Secondary | ICD-10-CM | POA: Diagnosis not present

## 2020-03-16 DIAGNOSIS — N261 Atrophy of kidney (terminal): Secondary | ICD-10-CM | POA: Diagnosis not present

## 2020-03-16 DIAGNOSIS — N1832 Chronic kidney disease, stage 3b: Secondary | ICD-10-CM | POA: Diagnosis not present

## 2020-03-16 DIAGNOSIS — I129 Hypertensive chronic kidney disease with stage 1 through stage 4 chronic kidney disease, or unspecified chronic kidney disease: Secondary | ICD-10-CM | POA: Diagnosis not present

## 2020-03-16 DIAGNOSIS — E1122 Type 2 diabetes mellitus with diabetic chronic kidney disease: Secondary | ICD-10-CM | POA: Diagnosis not present

## 2020-03-16 DIAGNOSIS — N183 Chronic kidney disease, stage 3 unspecified: Secondary | ICD-10-CM | POA: Diagnosis not present

## 2020-03-21 DIAGNOSIS — D3132 Benign neoplasm of left choroid: Secondary | ICD-10-CM | POA: Diagnosis not present

## 2020-03-21 DIAGNOSIS — H25811 Combined forms of age-related cataract, right eye: Secondary | ICD-10-CM | POA: Diagnosis not present

## 2020-03-21 DIAGNOSIS — H353221 Exudative age-related macular degeneration, left eye, with active choroidal neovascularization: Secondary | ICD-10-CM | POA: Diagnosis not present

## 2020-03-21 DIAGNOSIS — E119 Type 2 diabetes mellitus without complications: Secondary | ICD-10-CM | POA: Diagnosis not present

## 2020-03-21 DIAGNOSIS — Z961 Presence of intraocular lens: Secondary | ICD-10-CM | POA: Diagnosis not present

## 2020-03-21 DIAGNOSIS — H353112 Nonexudative age-related macular degeneration, right eye, intermediate dry stage: Secondary | ICD-10-CM | POA: Diagnosis not present

## 2020-04-01 DIAGNOSIS — Z23 Encounter for immunization: Secondary | ICD-10-CM | POA: Diagnosis not present

## 2020-04-01 DIAGNOSIS — E1122 Type 2 diabetes mellitus with diabetic chronic kidney disease: Secondary | ICD-10-CM | POA: Diagnosis not present

## 2020-04-01 DIAGNOSIS — N183 Chronic kidney disease, stage 3 unspecified: Secondary | ICD-10-CM | POA: Diagnosis not present

## 2020-08-01 DIAGNOSIS — E1122 Type 2 diabetes mellitus with diabetic chronic kidney disease: Secondary | ICD-10-CM | POA: Diagnosis not present

## 2020-08-01 DIAGNOSIS — Z0000 Encounter for general adult medical examination without abnormal findings: Secondary | ICD-10-CM | POA: Diagnosis not present

## 2020-08-01 DIAGNOSIS — F319 Bipolar disorder, unspecified: Secondary | ICD-10-CM | POA: Diagnosis not present

## 2020-08-01 DIAGNOSIS — Z79899 Other long term (current) drug therapy: Secondary | ICD-10-CM | POA: Diagnosis not present

## 2020-08-01 DIAGNOSIS — I129 Hypertensive chronic kidney disease with stage 1 through stage 4 chronic kidney disease, or unspecified chronic kidney disease: Secondary | ICD-10-CM | POA: Diagnosis not present

## 2020-08-01 DIAGNOSIS — Z7982 Long term (current) use of aspirin: Secondary | ICD-10-CM | POA: Diagnosis not present

## 2020-08-01 DIAGNOSIS — E782 Mixed hyperlipidemia: Secondary | ICD-10-CM | POA: Diagnosis not present

## 2020-08-01 DIAGNOSIS — N183 Chronic kidney disease, stage 3 unspecified: Secondary | ICD-10-CM | POA: Diagnosis not present

## 2020-08-08 DIAGNOSIS — N1832 Chronic kidney disease, stage 3b: Secondary | ICD-10-CM | POA: Diagnosis not present

## 2020-08-11 DIAGNOSIS — N1832 Chronic kidney disease, stage 3b: Secondary | ICD-10-CM | POA: Diagnosis not present

## 2020-08-11 DIAGNOSIS — I129 Hypertensive chronic kidney disease with stage 1 through stage 4 chronic kidney disease, or unspecified chronic kidney disease: Secondary | ICD-10-CM | POA: Diagnosis not present

## 2020-08-11 DIAGNOSIS — E1122 Type 2 diabetes mellitus with diabetic chronic kidney disease: Secondary | ICD-10-CM | POA: Diagnosis not present

## 2020-08-11 DIAGNOSIS — Z87891 Personal history of nicotine dependence: Secondary | ICD-10-CM | POA: Diagnosis not present

## 2020-08-11 DIAGNOSIS — Z7984 Long term (current) use of oral hypoglycemic drugs: Secondary | ICD-10-CM | POA: Diagnosis not present

## 2020-09-30 DIAGNOSIS — G5601 Carpal tunnel syndrome, right upper limb: Secondary | ICD-10-CM | POA: Diagnosis not present

## 2020-09-30 DIAGNOSIS — M65321 Trigger finger, right index finger: Secondary | ICD-10-CM | POA: Diagnosis not present

## 2020-12-12 DIAGNOSIS — N1832 Chronic kidney disease, stage 3b: Secondary | ICD-10-CM | POA: Diagnosis not present

## 2020-12-15 DIAGNOSIS — N1832 Chronic kidney disease, stage 3b: Secondary | ICD-10-CM | POA: Diagnosis not present

## 2020-12-15 DIAGNOSIS — I129 Hypertensive chronic kidney disease with stage 1 through stage 4 chronic kidney disease, or unspecified chronic kidney disease: Secondary | ICD-10-CM | POA: Diagnosis not present

## 2020-12-15 DIAGNOSIS — N183 Chronic kidney disease, stage 3 unspecified: Secondary | ICD-10-CM | POA: Diagnosis not present

## 2020-12-15 DIAGNOSIS — E1122 Type 2 diabetes mellitus with diabetic chronic kidney disease: Secondary | ICD-10-CM | POA: Diagnosis not present

## 2021-01-30 DIAGNOSIS — N1832 Chronic kidney disease, stage 3b: Secondary | ICD-10-CM | POA: Diagnosis not present

## 2021-01-30 DIAGNOSIS — L570 Actinic keratosis: Secondary | ICD-10-CM | POA: Diagnosis not present

## 2021-01-30 DIAGNOSIS — N183 Chronic kidney disease, stage 3 unspecified: Secondary | ICD-10-CM | POA: Diagnosis not present

## 2021-01-30 DIAGNOSIS — I129 Hypertensive chronic kidney disease with stage 1 through stage 4 chronic kidney disease, or unspecified chronic kidney disease: Secondary | ICD-10-CM | POA: Diagnosis not present

## 2021-01-30 DIAGNOSIS — Z7984 Long term (current) use of oral hypoglycemic drugs: Secondary | ICD-10-CM | POA: Diagnosis not present

## 2021-01-30 DIAGNOSIS — R413 Other amnesia: Secondary | ICD-10-CM | POA: Diagnosis not present

## 2021-01-30 DIAGNOSIS — F319 Bipolar disorder, unspecified: Secondary | ICD-10-CM | POA: Diagnosis not present

## 2021-01-30 DIAGNOSIS — E782 Mixed hyperlipidemia: Secondary | ICD-10-CM | POA: Diagnosis not present

## 2021-01-30 DIAGNOSIS — E1122 Type 2 diabetes mellitus with diabetic chronic kidney disease: Secondary | ICD-10-CM | POA: Diagnosis not present

## 2021-03-02 DIAGNOSIS — E538 Deficiency of other specified B group vitamins: Secondary | ICD-10-CM | POA: Diagnosis not present

## 2021-03-20 DIAGNOSIS — Z23 Encounter for immunization: Secondary | ICD-10-CM | POA: Diagnosis not present

## 2021-03-20 DIAGNOSIS — R413 Other amnesia: Secondary | ICD-10-CM | POA: Diagnosis not present

## 2021-03-20 DIAGNOSIS — M62838 Other muscle spasm: Secondary | ICD-10-CM | POA: Diagnosis not present

## 2021-04-29 ENCOUNTER — Emergency Department (HOSPITAL_BASED_OUTPATIENT_CLINIC_OR_DEPARTMENT_OTHER)
Admission: EM | Admit: 2021-04-29 | Discharge: 2021-04-29 | Disposition: A | Discharge status: Home / Self Care | Payer: PPO | Attending: Emergency Medicine | Admitting: Emergency Medicine

## 2021-04-29 ENCOUNTER — Emergency Department (HOSPITAL_BASED_OUTPATIENT_CLINIC_OR_DEPARTMENT_OTHER): Payer: PPO

## 2021-04-29 ENCOUNTER — Other Ambulatory Visit: Payer: Self-pay

## 2021-04-29 ENCOUNTER — Encounter (HOSPITAL_BASED_OUTPATIENT_CLINIC_OR_DEPARTMENT_OTHER): Payer: Self-pay | Admitting: Emergency Medicine

## 2021-04-29 DIAGNOSIS — Z87891 Personal history of nicotine dependence: Secondary | ICD-10-CM | POA: Insufficient documentation

## 2021-04-29 DIAGNOSIS — R109 Unspecified abdominal pain: Secondary | ICD-10-CM | POA: Diagnosis not present

## 2021-04-29 DIAGNOSIS — E119 Type 2 diabetes mellitus without complications: Secondary | ICD-10-CM | POA: Diagnosis not present

## 2021-04-29 DIAGNOSIS — R1031 Right lower quadrant pain: Secondary | ICD-10-CM | POA: Insufficient documentation

## 2021-04-29 DIAGNOSIS — R319 Hematuria, unspecified: Secondary | ICD-10-CM | POA: Insufficient documentation

## 2021-04-29 HISTORY — DX: Type 2 diabetes mellitus without complications: E11.9

## 2021-04-29 LAB — COMPREHENSIVE METABOLIC PANEL
ALT: 12 U/L (ref 0–44)
AST: 14 U/L — ABNORMAL LOW (ref 15–41)
Albumin: 3.6 g/dL (ref 3.5–5.0)
Alkaline Phosphatase: 54 U/L (ref 38–126)
Anion gap: 7 (ref 5–15)
BUN: 26 mg/dL — ABNORMAL HIGH (ref 8–23)
CO2: 25 mmol/L (ref 22–32)
Calcium: 9 mg/dL (ref 8.9–10.3)
Chloride: 103 mmol/L (ref 98–111)
Creatinine, Ser: 1.73 mg/dL — ABNORMAL HIGH (ref 0.61–1.24)
GFR, Estimated: 38 mL/min — ABNORMAL LOW (ref >60)
Glucose, Bld: 142 mg/dL — ABNORMAL HIGH (ref 70–99)
Potassium: 3.9 mmol/L (ref 3.5–5.1)
Sodium: 135 mmol/L (ref 135–145)
Total Bilirubin: 0.6 mg/dL (ref 0.3–1.2)
Total Protein: 7 g/dL (ref 6.5–8.1)

## 2021-04-29 LAB — CBC WITH DIFFERENTIAL/PLATELET
Abs Immature Granulocytes: 0.08 10*3/uL — ABNORMAL HIGH (ref 0.00–0.07)
Basophils Absolute: 0.1 10*3/uL (ref 0.0–0.1)
Basophils Relative: 1 %
Eosinophils Absolute: 0.1 10*3/uL (ref 0.0–0.5)
Eosinophils Relative: 2 %
HCT: 36.5 % — ABNORMAL LOW (ref 39.0–52.0)
Hemoglobin: 12.9 g/dL — ABNORMAL LOW (ref 13.0–17.0)
Immature Granulocytes: 2 %
Lymphocytes Relative: 23 %
Lymphs Abs: 1.1 10*3/uL (ref 0.7–4.0)
MCH: 30.6 pg (ref 26.0–34.0)
MCHC: 35.3 g/dL (ref 30.0–36.0)
MCV: 86.5 fL (ref 80.0–100.0)
Monocytes Absolute: 0.3 10*3/uL (ref 0.1–1.0)
Monocytes Relative: 6 %
Neutro Abs: 3.4 10*3/uL (ref 1.7–7.7)
Neutrophils Relative %: 66 %
Platelets: 150 10*3/uL (ref 150–400)
RBC: 4.22 MIL/uL (ref 4.22–5.81)
RDW: 12.7 % (ref 11.5–15.5)
WBC: 5 10*3/uL (ref 4.0–10.5)
nRBC: 0 % (ref 0.0–0.2)

## 2021-04-29 LAB — URINALYSIS, ROUTINE W REFLEX MICROSCOPIC
Bilirubin Urine: NEGATIVE
Glucose, UA: NEGATIVE mg/dL
Ketones, ur: NEGATIVE mg/dL
Nitrite: NEGATIVE
Protein, ur: 100 mg/dL — AB
Specific Gravity, Urine: 1.02 (ref 1.005–1.030)
pH: 5 (ref 5.0–8.0)

## 2021-04-29 LAB — URINALYSIS, MICROSCOPIC (REFLEX)

## 2021-04-29 LAB — LIPASE, BLOOD: Lipase: 110 U/L — ABNORMAL HIGH (ref 11–51)

## 2021-04-29 MED ORDER — IOHEXOL 300 MG/ML  SOLN
75.0000 mL | Freq: Once | INTRAMUSCULAR | Status: AC | PRN
  Administered 2021-04-29: 75 mL via INTRAVENOUS

## 2021-04-29 NOTE — ED Triage Notes (Signed)
Pt c/o right lower abdominal pain for couple of days. Pt denies any other symptoms.

## 2021-04-29 NOTE — ED Provider Notes (Signed)
[facility] EMERGENCY DEPARTMENT Provider Note   CSN: [id number] Arrival date & time: 04/29/21  1337     History Chief Complaint  Patient presents with   Abdominal Pain    Philip Schultz is a 87 y.o. male.  HPI Patient is an 87 year old male with past medical history is negative for DM2 well-controlled with oral antihyperglycemic's  Patient is presented to the ER today with complaints of intermittent right lower quadrant abdominal pain ongoing since 2 days ago.  States it has been episodic sharp stabbing pain in the right lower quadrant of his abdomen.  Does not seem to have any aggravating mitigating factors.  Is not aggravated by eating movement or any other activities.  States that the pain is 10/10 when it occurs and severe but only last for several seconds.  Seems to occur every 15 to 20 minutes.  No associated urinary symptoms of hematuria or dysuria or hesitancy.  No history of kidney stones.  No history of diverticulitis.  He has not had any intra-abdominal operations.  No nausea or vomiting no chest pain shortness of breath.    Past Medical History:  Diagnosis Date   Diabetes mellitus without complication ([place])     There are no problems to display for this patient.   History reviewed. No pertinent surgical history.     No family history on file.  Social History   Tobacco Use   Smoking status: Former    Types: Cigarettes   Smokeless tobacco: Never  Vaping Use   Vaping Use: Never used  Substance Use Topics   Alcohol use: Yes    Comment: beer and wine occassional   Drug use: Never    Home Medications Prior to Admission medications   Not on File    Allergies    Patient has no known allergies.  Review of Systems   Review of Systems  Constitutional:  Negative for chills and fever.  HENT:  Negative for congestion.   Eyes:  Negative for pain.  Respiratory:  Negative for cough and shortness of breath.   Cardiovascular:  Negative for  chest pain and leg swelling.  Gastrointestinal:  Positive for abdominal pain. Negative for diarrhea, nausea and vomiting.  Genitourinary:  Negative for dysuria.  Musculoskeletal:  Negative for myalgias.  Skin:  Negative for rash.  Neurological:  Negative for dizziness and headaches.   Physical Exam Updated Vital Signs BP (!) 173/75   Pulse 62   Temp 98.1 F (36.7 C) (Oral)   Resp 15   Ht 5\' 11"  (1.803 m)   Wt 81.6 kg   SpO2 94%   BMI 25.10 kg/m   Physical Exam Vitals and nursing note reviewed.  Constitutional:      General: He is not in acute distress.    Appearance: Normal appearance. He is not ill-appearing.  HENT:     Head: Normocephalic and atraumatic.     Nose: Nose normal.  Eyes:     General: No scleral icterus.       Right eye: No discharge.        Left eye: No discharge.     Conjunctiva/sclera: Conjunctivae normal.  Cardiovascular:     Rate and Rhythm: Normal rate and regular rhythm.     Pulses: Normal pulses.     Heart sounds: Normal heart sounds.  Pulmonary:     Effort: Pulmonary effort is normal. No respiratory distress.     Breath sounds: No stridor. No wheezing.  Abdominal:  Palpations: Abdomen is soft.     Tenderness: There is abdominal tenderness.     Comments: Mild tenderness to palpation of right lower quadrant.  No guarding or rebound.  No other abdominal tenderness.  No CVA tenderness.  Musculoskeletal:     Cervical back: Normal range of motion.     Right lower leg: No edema.     Left lower leg: No edema.  Skin:    General: Skin is warm and dry.     Capillary Refill: Capillary refill takes less than 2 seconds.  Neurological:     Mental Status: He is alert and oriented to person, place, and time. Mental status is at baseline.  Psychiatric:        Mood and Affect: Mood normal.        Behavior: Behavior normal.    ED Results / Procedures / Treatments   Labs (all labs ordered are listed, but only abnormal results are displayed) Labs  Reviewed  CBC WITH DIFFERENTIAL/PLATELET - Abnormal; Notable for the following components:      Result Value   Hemoglobin 12.9 (*)    HCT 36.5 (*)    Abs Immature Granulocytes 0.08 (*)    All other components within normal limits  COMPREHENSIVE METABOLIC PANEL - Abnormal; Notable for the following components:   Glucose, Bld 142 (*)    BUN 26 (*)    Creatinine, Ser 1.73 (*)    AST 14 (*)    GFR, Estimated 38 (*)    All other components within normal limits  LIPASE, BLOOD - Abnormal; Notable for the following components:   Lipase 110 (*)    All other components within normal limits  URINALYSIS, ROUTINE W REFLEX MICROSCOPIC - Abnormal; Notable for the following components:   Hgb urine dipstick SMALL (*)    Protein, ur 100 (*)    Leukocytes,Ua SMALL (*)    All other components within normal limits  URINALYSIS, MICROSCOPIC (REFLEX) - Abnormal; Notable for the following components:   Bacteria, UA FEW (*)    All other components within normal limits    EKG None  Radiology CT ABDOMEN PELVIS W CONTRAST  Result Date: 04/29/2021 CLINICAL DATA:  Right lower quadrant abdominal pain. EXAM: CT ABDOMEN AND PELVIS WITH CONTRAST TECHNIQUE: Multidetector CT imaging of the abdomen and pelvis was performed using the standard protocol following bolus administration of intravenous contrast. CONTRAST:  63mL OMNIPAQUE IOHEXOL 300 MG/ML  SOLN COMPARISON:  CT abdomen pelvis 09/05/2001 FINDINGS: Lower chest: Mild reticulations. Pleural calcifications. Calcified granulomas. Vague nodule like density along the right minor fissure (3:1). Coronary artery calcifications. Tiny hiatal hernia. Hepatobiliary: No focal liver abnormality. No gallstones, gallbladder wall thickening, or pericholecystic fluid. No biliary dilatation. Pancreas: No focal lesion. Normal pancreatic contour. No surrounding inflammatory changes. No main pancreatic ductal dilatation. Spleen: Normal in size without focal abnormality.  Adrenals/Urinary Tract: No adrenal nodule bilaterally. Bilateral kidneys enhance symmetrically. There is a 1.5 cm fluid density lesion within the right kidney likely represents a simple renal cyst. There is a similar finding on the left measuring 1.1 cm that also likely represents a simple renal cyst. Interval decrease in size of an exophytic partially calcified 1.3 cm lesion along the inferior pole of the right kidney with a density of 44 Hounsfield units. Possible punctate left nephrolithiasis. Other calcifications associated with the kidney appear to be vascular. No hydronephrosis. No hydroureter. The urinary bladder is unremarkable. On delayed imaging, there is no urothelial wall thickening and there are no filling  defects in the opacified portions of the bilateral collecting systems or ureters. Stomach/Bowel: Stomach is within normal limits. No evidence of bowel wall thickening or dilatation. Scattered colonic diverticulosis. Appendix appears normal. Vascular/Lymphatic: No abdominal aorta or iliac aneurysm. Severe mild atherosclerotic plaque of the aorta and its branches. No abdominal, pelvic, or inguinal lymphadenopathy. Reproductive: The prostate is enlarged measuring up to 5 cm. Other: No intraperitoneal free fluid. No intraperitoneal free gas. No organized fluid collection. Musculoskeletal: No abdominal wall hernia or abnormality. No suspicious lytic or blastic osseous lesions. No acute displaced fracture. Multilevel degenerative changes of the spine. IMPRESSION: 1. Normal appendix. 2. Punctate left nephrolithiasis. 3. Scattered colonic diverticulosis with no acute diverticulitis. 4. Interval decrease in size of a now partially calcified indeterminate 1.3 cm right renal lesion. 5. Prostatomegaly. 6.  Aortic Atherosclerosis (ICD10-I70.0). Electronically Signed   By: Iven Finn M.D.   On: 04/29/2021 16:03    Procedures Procedures   Medications Ordered in ED Medications  iohexol (OMNIPAQUE) 300  MG/ML solution 75 mL (75 mLs Intravenous Contrast Given 04/29/21 1542)    ED Course  I have reviewed the triage vital signs and the nursing notes.  Pertinent labs & imaging results that were available during my care of the patient were reviewed by me and considered in my medical decision making (see chart for details).  Clinical Course as of 04/29/21 1732  Sat Apr 29, 2021  1534 Creatinine(!): 1.73 Creat trends between 1.5-1.8 at baseline [WF]  1535 No significant anemia.  No leukocytosis. CMP notable for baseline creatinine.  BUN marginally elevated perhaps somewhat dehydrated. [WF]  1536 Lipase marginally elevated however not diagnostic for pancreatitis and patient's pain is not consistent with pancreatitis. [WF]  1632 Hgb urine dipstick(!): SMALL [WF]    Clinical Course User Index [WF] Tedd Sias, PA   MDM Rules/Calculators/A&P                          Patient with intermittent episodes of right lower quadrant abdominal pain over the past 2 days.  He states he is on day 3 currently of symptoms.  Since he has been in the ER he has not had any episodes of pain.  Physical exam is relatively unremarkable he is endorsing some discomfort with right lower quadrant palpation but states that it is not significant.  He is not flinching on my examination no guarding.  Appears to be in no acute distress.  States that his pain is completely absent when I am not palpating his right lower quadrant.  Notably patient does have a small amount of hematuria and CT scan of the abdomen and pelvis does show some small nephrolithiasis question whether he may be passing small stones however the frequency of these episodic pains seems to be too high for this.  Could be some residual spasm from a single stone that he passed.  Ultimately CT abdomen pelvis with contrast does not show any acute abnormalities.  I did discuss with him the incidental findings of a mass which seems to have improved from his last  imaging and he understands he will need to follow-up with urology as well as his primary care provider.  He was given return precautions to the ER.  He does not have any difficulty being is aware that his enlarged prostate will also be a topic of discussion with his urologist.  Patient discharged home in good condition.  Vital signs within normal limits at time of  discharge & all questions answered the best my ability.  Final Clinical Impression(s) / ED Diagnoses Final diagnoses:  RLQ abdominal pain  Hematuria, unspecified type    Rx / DC Orders ED Discharge Orders     None        Tedd Sias, [place] 04/29/21 2109    Gareth Morgan, MD 05/01/21 [facility]

## 2021-04-29 NOTE — Discharge Instructions (Addendum)
Your work-up today was quite reassuring.  He did have a small amount of blood in your urine and you have evidence of some small stones within your kidney which would not be causing any of the symptoms you described today however it is possible that you passed a small kidney stone  Ultimately I recommend follow-up with your primary care provider.  I also have given you the information for urology to follow-up with. Hematuria can represent blood from a kidney stone being passed he can also be the first sign of a tumor.

## 2021-05-02 DIAGNOSIS — Z7984 Long term (current) use of oral hypoglycemic drugs: Secondary | ICD-10-CM | POA: Diagnosis not present

## 2021-05-02 DIAGNOSIS — N1832 Chronic kidney disease, stage 3b: Secondary | ICD-10-CM | POA: Diagnosis not present

## 2021-05-02 DIAGNOSIS — I129 Hypertensive chronic kidney disease with stage 1 through stage 4 chronic kidney disease, or unspecified chronic kidney disease: Secondary | ICD-10-CM | POA: Diagnosis not present

## 2021-05-02 DIAGNOSIS — Z87891 Personal history of nicotine dependence: Secondary | ICD-10-CM | POA: Diagnosis not present

## 2021-05-02 DIAGNOSIS — N200 Calculus of kidney: Secondary | ICD-10-CM | POA: Diagnosis not present

## 2021-05-02 DIAGNOSIS — E1122 Type 2 diabetes mellitus with diabetic chronic kidney disease: Secondary | ICD-10-CM | POA: Diagnosis not present

## 2021-06-15 DIAGNOSIS — F02A11 Dementia in other diseases classified elsewhere, mild, with agitation: Secondary | ICD-10-CM | POA: Diagnosis not present

## 2021-06-15 DIAGNOSIS — G301 Alzheimer's disease with late onset: Secondary | ICD-10-CM | POA: Diagnosis not present

## 2022-04-13 DIAGNOSIS — J9601 Acute respiratory failure with hypoxia: Secondary | ICD-10-CM | POA: Insufficient documentation

## 2022-04-13 DIAGNOSIS — J189 Pneumonia, unspecified organism: Secondary | ICD-10-CM | POA: Insufficient documentation

## 2022-04-15 DIAGNOSIS — I350 Nonrheumatic aortic (valve) stenosis: Secondary | ICD-10-CM | POA: Insufficient documentation

## 2022-06-06 IMAGING — CT CT ABD-PELV W/ CM
2 of 5 series · 16 of 46 positions shown, 18 images · IV contrast (Omnipaque)
Comparison: CT abdomen pelvis 09/05/2001

CLINICAL DATA: Right lower quadrant abdominal pain.

EXAM:
CT ABDOMEN AND PELVIS WITH CONTRAST
TECHNIQUE: Multidetector CT imaging of the abdomen and pelvis was performed
using the standard protocol following bolus administration of
intravenous contrast.
CONTRAST:  75mL OMNIPAQUE IOHEXOL 300 MG/ML  SOLN

[Series 2: axial st · axial · 0.79mm/px · z∈[+795,+1190]mm · 13 of 89 slices shown, 15 images]
[im 5/89  soft-tissue]
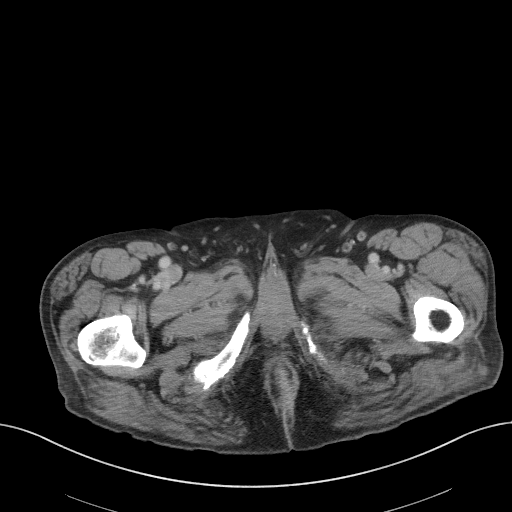
[im 5/89  bone]
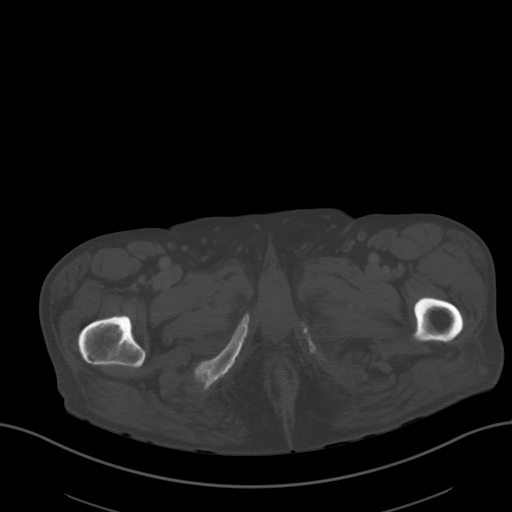
[im 14/89  soft-tissue]
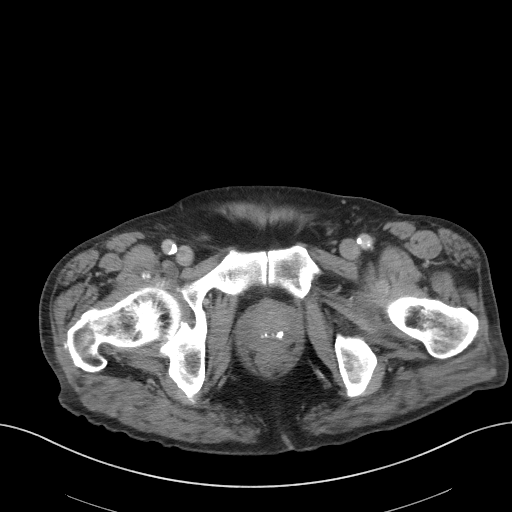
[im 18/89  soft-tissue]
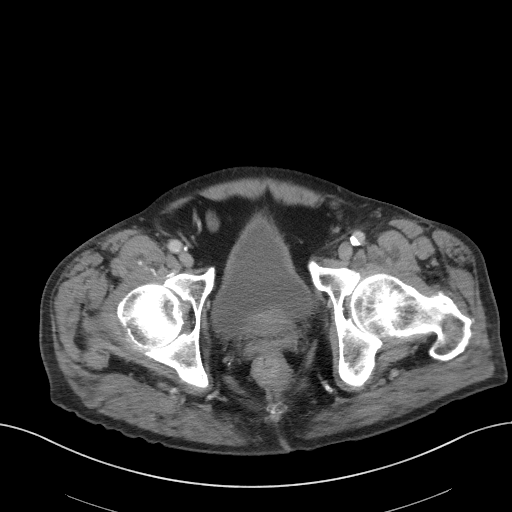
[im 27/89  soft-tissue]
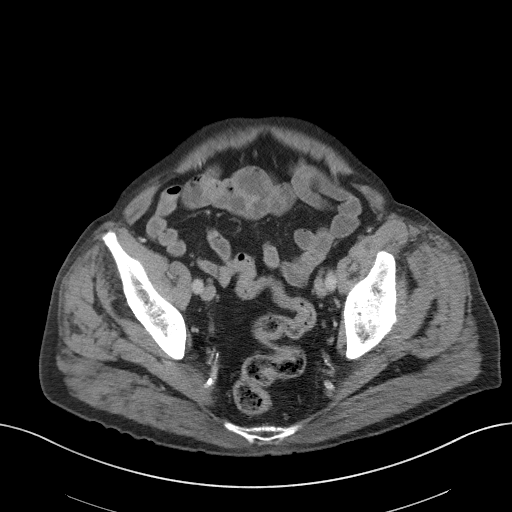
[im 31/89  soft-tissue]
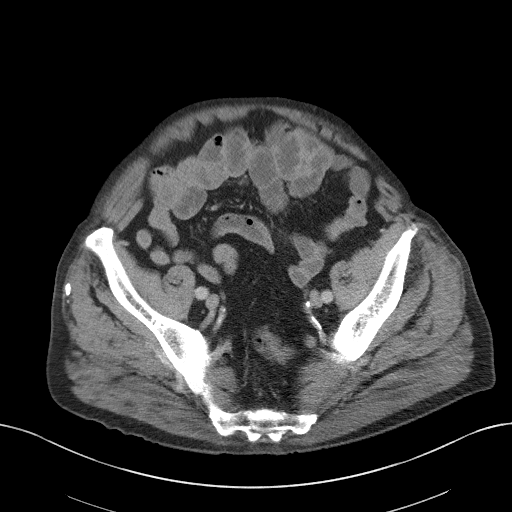
[im 40/89  soft-tissue]
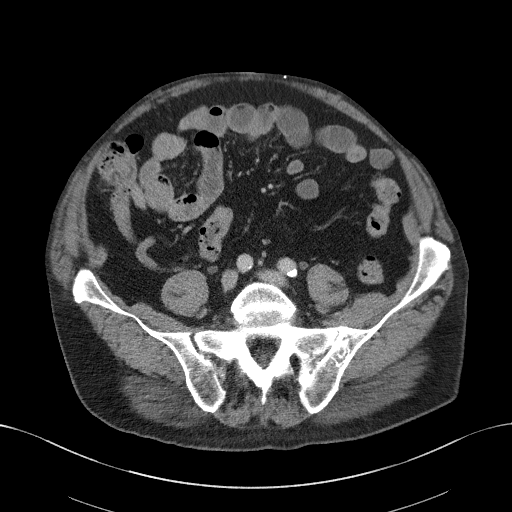
[im 45/89  soft-tissue]
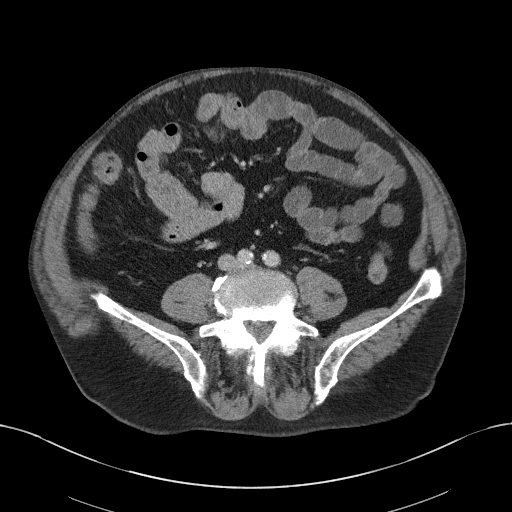
[im 49/89  soft-tissue]
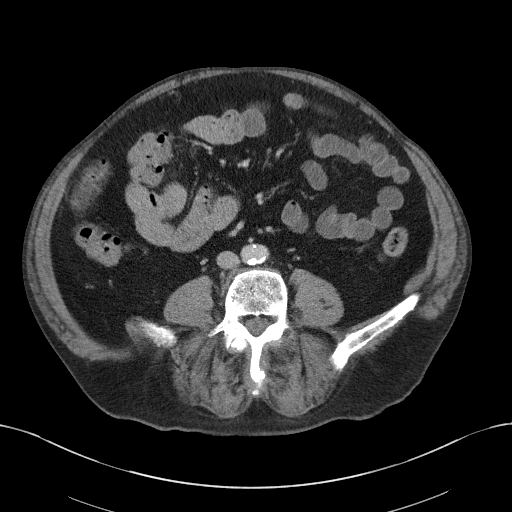
[im 58/89  soft-tissue]
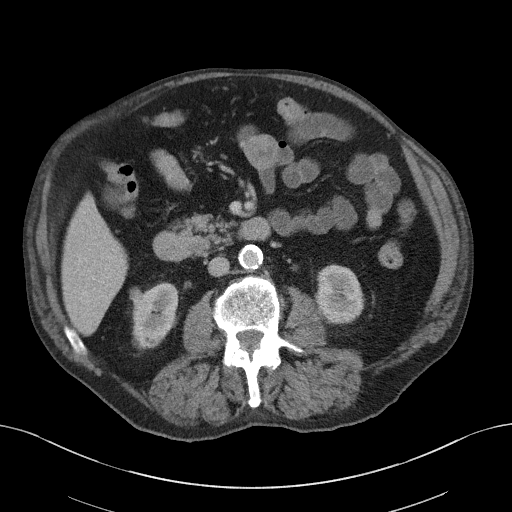
[im 58/89  bone]
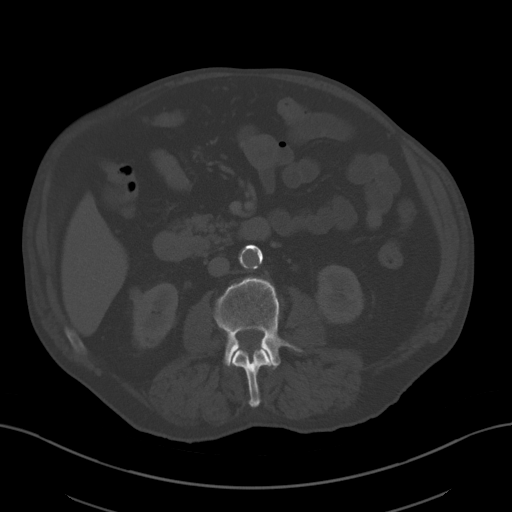
[im 62/89  soft-tissue]
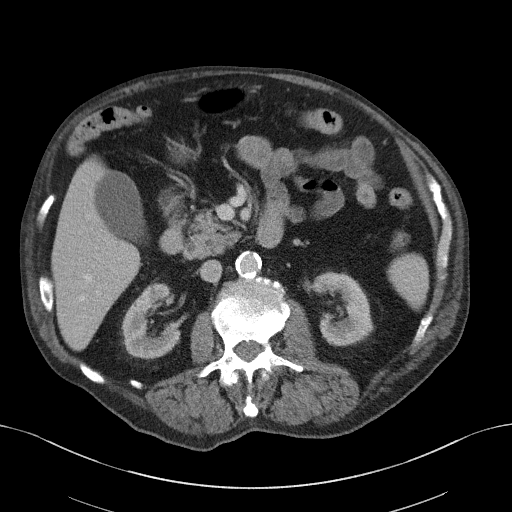
[im 71/89  soft-tissue]
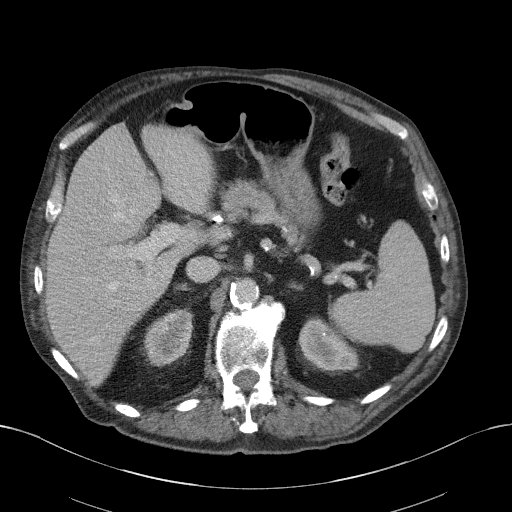
[im 75/89  soft-tissue]
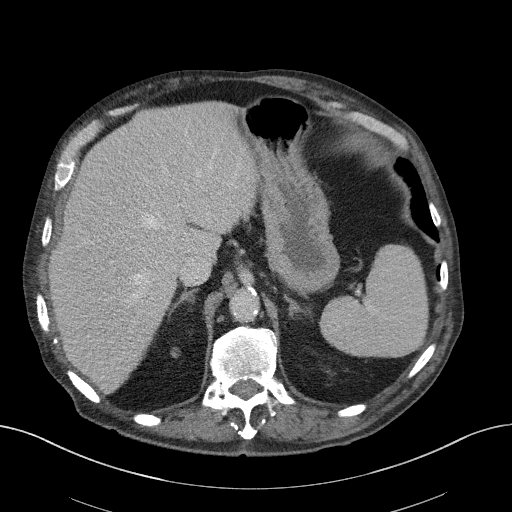
[im 84/89  soft-tissue]
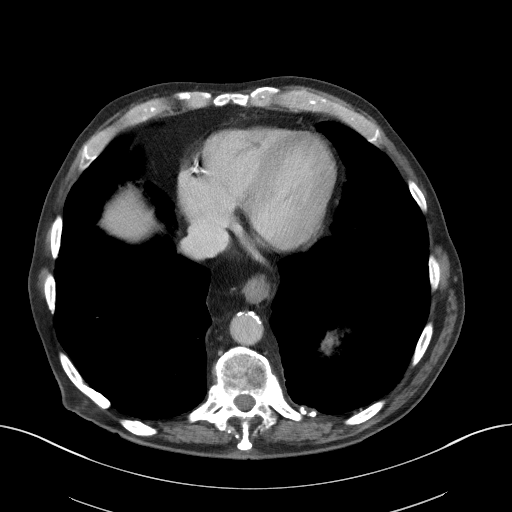

[Series 5: coronal st · coronal · 0.83mm/px · 3 of 104 slices shown]
[im 35/104  soft-tissue]
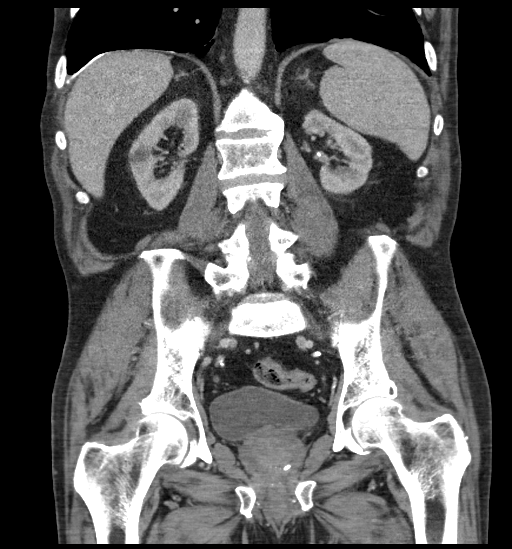
[im 46/104  soft-tissue]
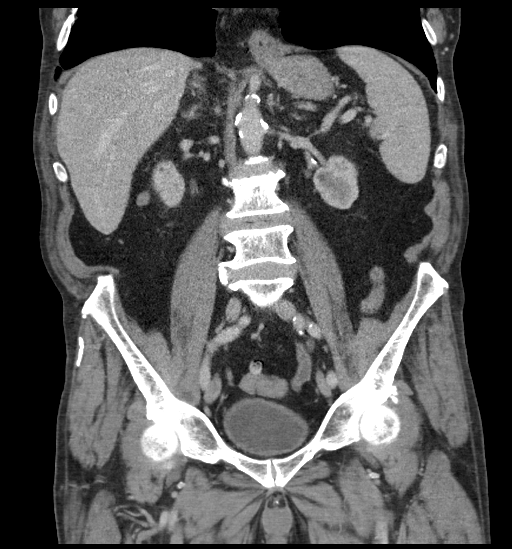
[im 58/104  soft-tissue]
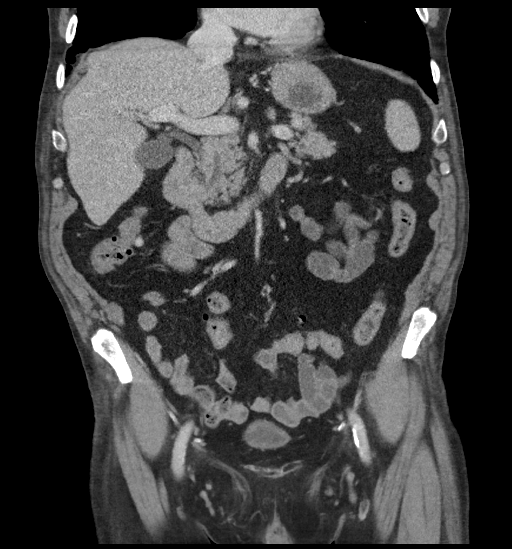

[16 of 46 positions shown; findings below may reference images not displayed]

FINDINGS: Lower chest: Mild reticulations. Pleural calcifications. Calcified
granulomas. Vague nodule like density along the right minor fissure
([DATE]). Coronary artery calcifications. Tiny hiatal hernia.

Hepatobiliary: No focal liver abnormality. No gallstones,
gallbladder wall thickening, or pericholecystic fluid. No biliary
dilatation.

Pancreas: No focal lesion. Normal pancreatic contour. No surrounding
inflammatory changes. No main pancreatic ductal dilatation.

Spleen: Normal in size without focal abnormality.

Adrenals/Urinary Tract:

No adrenal nodule bilaterally.

Bilateral kidneys enhance symmetrically. There is a 1.5 cm fluid
density lesion within the right kidney likely represents a simple
renal cyst. There is a similar finding on the left measuring 1.1 cm
that also likely represents a simple renal cyst. Interval decrease
in size of an exophytic partially calcified 1.3 cm lesion along the
inferior pole of the right kidney with a density of 44 Hounsfield
units. Possible punctate left nephrolithiasis. Other calcifications
associated with the kidney appear to be vascular.

No hydronephrosis. No hydroureter.

The urinary bladder is unremarkable.

On delayed imaging, there is no urothelial wall thickening and there
are no filling defects in the opacified portions of the bilateral
collecting systems or ureters.

Stomach/Bowel: Stomach is within normal limits. No evidence of bowel
wall thickening or dilatation. Scattered colonic diverticulosis.
Appendix appears normal.

Vascular/Lymphatic: No abdominal aorta or iliac aneurysm. Severe
mild atherosclerotic plaque of the aorta and its branches. No
abdominal, pelvic, or inguinal lymphadenopathy.

Reproductive: The prostate is enlarged measuring up to 5 cm.

Other: No intraperitoneal free fluid. No intraperitoneal free gas.
No organized fluid collection.

Musculoskeletal:

No abdominal wall hernia or abnormality.

No suspicious lytic or blastic osseous lesions. No acute displaced
fracture. Multilevel degenerative changes of the spine.
IMPRESSION: 1. Normal appendix.
2. Punctate left nephrolithiasis.
3. Scattered colonic diverticulosis with no acute diverticulitis.
4. Interval decrease in size of a now partially calcified
indeterminate 1.3 cm right renal lesion.
5. Prostatomegaly.
6.  Aortic Atherosclerosis ([id number]).

## 2022-06-29 ENCOUNTER — Encounter (HOSPITAL_BASED_OUTPATIENT_CLINIC_OR_DEPARTMENT_OTHER): Payer: Self-pay

## 2022-06-29 ENCOUNTER — Emergency Department (HOSPITAL_BASED_OUTPATIENT_CLINIC_OR_DEPARTMENT_OTHER)
Admission: EM | Admit: 2022-06-29 | Discharge: 2022-06-29 | Disposition: A | Discharge status: Home / Self Care | Payer: PPO | Attending: Emergency Medicine | Admitting: Emergency Medicine

## 2022-06-29 ENCOUNTER — Other Ambulatory Visit: Payer: Self-pay

## 2022-06-29 ENCOUNTER — Emergency Department (HOSPITAL_BASED_OUTPATIENT_CLINIC_OR_DEPARTMENT_OTHER): Payer: PPO

## 2022-06-29 DIAGNOSIS — Z1152 Encounter for screening for COVID-19: Secondary | ICD-10-CM | POA: Diagnosis not present

## 2022-06-29 DIAGNOSIS — R059 Cough, unspecified: Secondary | ICD-10-CM | POA: Diagnosis present

## 2022-06-29 DIAGNOSIS — R051 Acute cough: Secondary | ICD-10-CM | POA: Insufficient documentation

## 2022-06-29 HISTORY — DX: Dementia in other diseases classified elsewhere, unspecified severity, without behavioral disturbance, psychotic disturbance, mood disturbance, and anxiety: G30.9

## 2022-06-29 HISTORY — DX: Transient cerebral ischemic attack, unspecified: G45.9

## 2022-06-29 HISTORY — DX: Atherosclerotic heart disease of native coronary artery without angina pectoris: I25.10

## 2022-06-29 HISTORY — DX: Essential (primary) hypertension: I10

## 2022-06-29 HISTORY — DX: Chronic kidney disease, unspecified: N18.9

## 2022-06-29 LAB — RESP PANEL BY RT-PCR (RSV, FLU A&B, COVID)  RVPGX2
Influenza A by PCR: NEGATIVE
Influenza B by PCR: NEGATIVE
Resp Syncytial Virus by PCR: NEGATIVE
SARS Coronavirus 2 by RT PCR: NEGATIVE

## 2022-06-29 MED ORDER — ONDANSETRON HCL 4 MG PO TABS: 4.0000 mg | ORAL_TABLET | Freq: Three times a day (TID) | ORAL | 0 refills | Status: DC | PRN

## 2022-06-29 MED ORDER — ONDANSETRON HCL 4 MG PO TABS: 4.0000 mg | ORAL_TABLET | Freq: Four times a day (QID) | ORAL | 0 refills | Status: DC | PRN

## 2022-06-29 MED ORDER — ONDANSETRON 4 MG PO TBDP
4.0000 mg | ORAL_TABLET | Freq: Once | ORAL | Status: AC
  Administered 2022-06-29: 4 mg via ORAL
  Filled 2022-06-29: qty 1

## 2022-06-29 NOTE — ED Triage Notes (Addendum)
Pt presents with cough, fatigue and decreased appetite x 2 days. Pt with hx of Alzheimer's. Pt is A/Ox4 at this time. Pt states he has no complaints at this time.

## 2022-06-29 NOTE — ED Provider Notes (Signed)
[facility] DEPART[facility] Provider Note   CSN: [id number] Arrival date & time: 06/29/22  1517     History Chief Complaint  Patient presents with   Cough    HPI Philip Schultz is a 88 y.o. male presenting for fever cough congestion over the past 3 days.  Poor p.o. intake per patient's daughter-in-law at bedside.  She states that he has a history of complex pneumonia that has been getting serial x-rays in the outpatient setting..   Patient's recorded medical, surgical, social, medication list and allergies were reviewed in the Snapshot window as part of the initial history.   Review of Systems   Review of Systems  Constitutional:  Negative for chills and fever.  HENT:  Negative for ear pain and sore throat.   Eyes:  Negative for pain and visual disturbance.  Respiratory:  Positive for cough and shortness of breath.   Cardiovascular:  Negative for chest pain and palpitations.  Gastrointestinal:  Negative for abdominal pain and vomiting.  Genitourinary:  Negative for dysuria and hematuria.  Musculoskeletal:  Negative for arthralgias and back pain.  Skin:  Negative for color change and rash.  Neurological:  Negative for seizures and syncope.  All other systems reviewed and are negative.   Physical Exam Updated Vital Signs BP 138/83   Pulse 73   Temp 97.9 F (36.6 C) (Oral)   Resp 16   Ht '5\' 11"'$  (1.803 m)   Wt 79.4 kg   SpO2 92%   BMI 24.41 kg/m  Physical Exam Vitals and nursing note reviewed.  Constitutional:      General: He is not in acute distress.    Appearance: He is well-developed.  HENT:     Head: Normocephalic and atraumatic.  Eyes:     Conjunctiva/sclera: Conjunctivae normal.  Cardiovascular:     Rate and Rhythm: Normal rate and regular rhythm.     Heart sounds: No murmur heard. Pulmonary:     Effort: Pulmonary effort is normal. No respiratory distress.     Breath sounds: Normal breath sounds.  Abdominal:     Palpations: Abdomen is  soft.     Tenderness: There is no abdominal tenderness.  Musculoskeletal:        General: No swelling.     Cervical back: Neck supple.  Skin:    General: Skin is warm and dry.     Capillary Refill: Capillary refill takes less than 2 seconds.  Neurological:     Mental Status: He is alert.  Psychiatric:        Mood and Affect: Mood normal.      ED Course/ Medical Decision Making/ A&P    Procedures Procedures   Medications Ordered in ED Medications  ondansetron (ZOFRAN-ODT) disintegrating tablet 4 mg (4 mg Oral Given 06/29/22 1710)   Medical Decision Making:   Philip Schultz is a 88 y.o. male who presented to the ED today with subjective fever, cough, congestion detailed above.    Additional history discussed with patient's family/caregivers.  Patient's presentation is complicated by their history of dementia.  Patient placed on continuous vitals and telemetry monitoring while in ED which was reviewed periodically.   Complete initial physical exam performed, notably the patient  was hemodynamically stable in no acute distress.  Posterior oropharynx illuminated and without obvious swelling or deformity.  Patient is without neck stiffness.    Reviewed and confirmed nursing documentation for past medical history, family history, social history.    Initial Assessment:  With the patient's presentation of fever cough congestion, most likely diagnosis is developing viral upper respiratory infection. Other diagnoses were considered including (but not limited to) peritonsillar abscess, retropharyngeal abscess, pneumonia. These are considered less likely due to history of present illness and physical exam findings.   This is most consistent with an acute life/limb threatening illness complicated by underlying chronic conditions. Considered meningitis, however patient's symptoms, vital signs, physical exam findings including lack of meningismus seem grossly less consistent at this  time. Initial Plan:   Screening labs including CBC and Metabolic panel to evaluate for infectious or metabolic etiology of disease.  Viral screening including COVID/flu testing to evaluate for common viral etiologies that need to be tracked CXR to evaluate for structural/infectious intrathoracic pathology.  Empiric treatment with antipyretics including acetaminophen in ambulatory setting Objective evaluation as below reviewed   Initial Study Results:   Laboratory  All laboratory results reviewed without evidence of clinically relevant pathology.   Radiology:  All images reviewed independently. Agree with radiology report at this time.   DG Chest Portable 1 View  Final Result         Final Assessment and Plan:   On reassessment, patient is ambulatory tolerating p.o. intake in no acute distress.   Patient's COVID test is negative. Patient's poor p.o. intake was treated with Zofran in the emergency department with resolution and p.o. intake.  I was called back to bedside as the patient is requesting discharge.  Patient is currently stable for outpatient care and management with no indication for hospitalization or transfer at this time.  Discussed all findings with patient expressed understanding.  Disposition:  Based on the above findings, I believe patient is stable for discharge.    Patient/family educated about specific return precautions for given chief complaint and symptoms.  Patient/family educated about follow-up with PCP.     Patient/family expressed understanding of return precautions and need for follow-up. Patient spoken to regarding all imaging and laboratory results and appropriate follow up for these results. All education provided in verbal form with additional information in written form. Time was allowed for answering of patient questions. Patient discharged.    Emergency Department Medication Summary:   Medications  ondansetron (ZOFRAN-ODT) disintegrating tablet  4 mg (4 mg Oral Given 06/29/22 1710)      Clinical Impression:  1. Acute cough      Discharge   Final Clinical Impression(s) / ED Diagnoses Final diagnoses:  Acute cough    Rx / DC Orders ED Discharge Orders          Ordered    ondansetron (ZOFRAN) 4 MG tablet  Every 6 hours PRN,   Status:  Discontinued        06/29/22 1906    ondansetron (ZOFRAN) 4 MG tablet  Every 6 hours PRN,   Status:  Discontinued        06/29/22 1917    ondansetron (ZOFRAN) 4 MG tablet  Every 6 hours PRN,   Status:  Discontinued        06/29/22 1919    ondansetron (ZOFRAN) 4 MG tablet  Every 8 hours PRN        06/29/22 1919              Tretha Sciara, MD 06/29/22 2248

## 2022-06-29 NOTE — ED Notes (Signed)
Dc instructions and scripts reviewed with pt and daughter no questions or concerns at this time. Will follow up as needed. Ambulated without difficulty out of ED.

## 2022-06-29 NOTE — ED Notes (Signed)
Radiology at bedside

## 2022-09-26 DIAGNOSIS — N1832 Chronic kidney disease, stage 3b: Secondary | ICD-10-CM | POA: Insufficient documentation

## 2022-10-16 DIAGNOSIS — E1129 Type 2 diabetes mellitus with other diabetic kidney complication: Secondary | ICD-10-CM | POA: Diagnosis present

## 2022-10-16 DIAGNOSIS — I10 Essential (primary) hypertension: Secondary | ICD-10-CM | POA: Insufficient documentation

## 2022-10-16 DIAGNOSIS — F0390 Unspecified dementia without behavioral disturbance: Secondary | ICD-10-CM | POA: Insufficient documentation

## 2022-10-16 DIAGNOSIS — N179 Acute kidney failure, unspecified: Secondary | ICD-10-CM | POA: Insufficient documentation

## 2022-10-16 DIAGNOSIS — N400 Enlarged prostate without lower urinary tract symptoms: Secondary | ICD-10-CM | POA: Insufficient documentation

## 2022-10-16 DIAGNOSIS — I441 Atrioventricular block, second degree: Secondary | ICD-10-CM | POA: Insufficient documentation

## 2023-01-25 ENCOUNTER — Encounter (HOSPITAL_BASED_OUTPATIENT_CLINIC_OR_DEPARTMENT_OTHER): Payer: Self-pay

## 2023-01-25 ENCOUNTER — Emergency Department (HOSPITAL_BASED_OUTPATIENT_CLINIC_OR_DEPARTMENT_OTHER): Payer: PPO

## 2023-01-25 ENCOUNTER — Other Ambulatory Visit: Payer: Self-pay

## 2023-01-25 ENCOUNTER — Inpatient Hospital Stay (HOSPITAL_BASED_OUTPATIENT_CLINIC_OR_DEPARTMENT_OTHER)
Admission: EM | Admit: 2023-01-25 | Discharge: 2023-01-29 | DRG: 177 | Disposition: A | Discharge status: Home Health | Payer: PPO | Source: Skilled Nursing Facility | Attending: Internal Medicine | Admitting: Internal Medicine

## 2023-01-25 DIAGNOSIS — D696 Thrombocytopenia, unspecified: Secondary | ICD-10-CM | POA: Diagnosis present

## 2023-01-25 DIAGNOSIS — J9601 Acute respiratory failure with hypoxia: Secondary | ICD-10-CM | POA: Diagnosis present

## 2023-01-25 DIAGNOSIS — Z79899 Other long term (current) drug therapy: Secondary | ICD-10-CM

## 2023-01-25 DIAGNOSIS — N400 Enlarged prostate without lower urinary tract symptoms: Secondary | ICD-10-CM | POA: Diagnosis present

## 2023-01-25 DIAGNOSIS — F0280 Dementia in other diseases classified elsewhere without behavioral disturbance: Secondary | ICD-10-CM | POA: Diagnosis present

## 2023-01-25 DIAGNOSIS — N1832 Chronic kidney disease, stage 3b: Secondary | ICD-10-CM | POA: Diagnosis present

## 2023-01-25 DIAGNOSIS — E1165 Type 2 diabetes mellitus with hyperglycemia: Secondary | ICD-10-CM | POA: Diagnosis present

## 2023-01-25 DIAGNOSIS — G309 Alzheimer's disease, unspecified: Secondary | ICD-10-CM | POA: Diagnosis present

## 2023-01-25 DIAGNOSIS — Z7982 Long term (current) use of aspirin: Secondary | ICD-10-CM

## 2023-01-25 DIAGNOSIS — Z8673 Personal history of transient ischemic attack (TIA), and cerebral infarction without residual deficits: Secondary | ICD-10-CM

## 2023-01-25 DIAGNOSIS — J690 Pneumonitis due to inhalation of food and vomit: Secondary | ICD-10-CM | POA: Diagnosis not present

## 2023-01-25 DIAGNOSIS — E1129 Type 2 diabetes mellitus with other diabetic kidney complication: Secondary | ICD-10-CM | POA: Diagnosis not present

## 2023-01-25 DIAGNOSIS — I129 Hypertensive chronic kidney disease with stage 1 through stage 4 chronic kidney disease, or unspecified chronic kidney disease: Secondary | ICD-10-CM | POA: Diagnosis present

## 2023-01-25 DIAGNOSIS — R001 Bradycardia, unspecified: Secondary | ICD-10-CM | POA: Diagnosis present

## 2023-01-25 DIAGNOSIS — Z7984 Long term (current) use of oral hypoglycemic drugs: Secondary | ICD-10-CM | POA: Diagnosis not present

## 2023-01-25 DIAGNOSIS — D649 Anemia, unspecified: Secondary | ICD-10-CM | POA: Diagnosis present

## 2023-01-25 DIAGNOSIS — I440 Atrioventricular block, first degree: Secondary | ICD-10-CM | POA: Diagnosis present

## 2023-01-25 DIAGNOSIS — E1140 Type 2 diabetes mellitus with diabetic neuropathy, unspecified: Secondary | ICD-10-CM | POA: Diagnosis present

## 2023-01-25 DIAGNOSIS — I2510 Atherosclerotic heart disease of native coronary artery without angina pectoris: Secondary | ICD-10-CM | POA: Diagnosis present

## 2023-01-25 DIAGNOSIS — E785 Hyperlipidemia, unspecified: Secondary | ICD-10-CM | POA: Diagnosis present

## 2023-01-25 DIAGNOSIS — Z87891 Personal history of nicotine dependence: Secondary | ICD-10-CM | POA: Diagnosis not present

## 2023-01-25 DIAGNOSIS — Z66 Do not resuscitate: Secondary | ICD-10-CM | POA: Diagnosis present

## 2023-01-25 DIAGNOSIS — E1122 Type 2 diabetes mellitus with diabetic chronic kidney disease: Secondary | ICD-10-CM | POA: Diagnosis present

## 2023-01-25 LAB — HEMOGLOBIN A1C
Hgb A1c MFr Bld: 7.2 % — ABNORMAL HIGH (ref 4.8–5.6)
Mean Plasma Glucose: 159.94 mg/dL

## 2023-01-25 LAB — I-STAT VENOUS BLOOD GAS, ED
Acid-Base Excess: 0 mmol/L (ref 0.0–2.0)
Bicarbonate: 26.2 mmol/L (ref 20.0–28.0)
Calcium, Ion: 1.25 mmol/L (ref 1.15–1.40)
HCT: 41 % (ref 39.0–52.0)
Hemoglobin: 13.9 g/dL (ref 13.0–17.0)
O2 Saturation: 37 %
Potassium: 4.2 mmol/L (ref 3.5–5.1)
Sodium: 137 mmol/L (ref 135–145)
TCO2: 28 mmol/L (ref 22–32)
pCO2, Ven: 47.8 mmHg (ref 44–60)
pH, Ven: 7.346 (ref 7.25–7.43)
pO2, Ven: 23 mmHg — CL (ref 32–45)

## 2023-01-25 LAB — CBC WITH DIFFERENTIAL/PLATELET
Abs Immature Granulocytes: 0.39 10*3/uL — ABNORMAL HIGH (ref 0.00–0.07)
Basophils Absolute: 0 10*3/uL (ref 0.0–0.1)
Basophils Relative: 1 %
Eosinophils Absolute: 0 10*3/uL (ref 0.0–0.5)
Eosinophils Relative: 0 %
HCT: 38 % — ABNORMAL LOW (ref 39.0–52.0)
Hemoglobin: 13.3 g/dL (ref 13.0–17.0)
Immature Granulocytes: 8 %
Lymphocytes Relative: 13 %
Lymphs Abs: 0.6 10*3/uL — ABNORMAL LOW (ref 0.7–4.0)
MCH: 30.3 pg (ref 26.0–34.0)
MCHC: 35 g/dL (ref 30.0–36.0)
MCV: 86.6 fL (ref 80.0–100.0)
Monocytes Absolute: 0.2 10*3/uL (ref 0.1–1.0)
Monocytes Relative: 5 %
Neutro Abs: 3.4 10*3/uL (ref 1.7–7.7)
Neutrophils Relative %: 73 %
Platelets: 166 10*3/uL (ref 150–400)
RBC: 4.39 MIL/uL (ref 4.22–5.81)
RDW: 12.5 % (ref 11.5–15.5)
Smear Review: NORMAL
WBC: 4.7 10*3/uL (ref 4.0–10.5)
nRBC: 0 % (ref 0.0–0.2)

## 2023-01-25 LAB — COMPREHENSIVE METABOLIC PANEL
ALT: 10 U/L (ref 0–44)
AST: 15 U/L (ref 15–41)
Albumin: 3.3 g/dL — ABNORMAL LOW (ref 3.5–5.0)
Alkaline Phosphatase: 49 U/L (ref 38–126)
Anion gap: 10 (ref 5–15)
BUN: 30 mg/dL — ABNORMAL HIGH (ref 8–23)
CO2: 22 mmol/L (ref 22–32)
Calcium: 9 mg/dL (ref 8.9–10.3)
Chloride: 100 mmol/L (ref 98–111)
Creatinine, Ser: 1.8 mg/dL — ABNORMAL HIGH (ref 0.61–1.24)
GFR, Estimated: 36 mL/min — ABNORMAL LOW (ref >60)
Glucose, Bld: 287 mg/dL — ABNORMAL HIGH (ref 70–99)
Potassium: 4.1 mmol/L (ref 3.5–5.1)
Sodium: 132 mmol/L — ABNORMAL LOW (ref 135–145)
Total Bilirubin: 0.4 mg/dL (ref 0.3–1.2)
Total Protein: 7.1 g/dL (ref 6.5–8.1)

## 2023-01-25 LAB — CBC
HCT: 34.5 % — ABNORMAL LOW (ref 39.0–52.0)
Hemoglobin: 11.8 g/dL — ABNORMAL LOW (ref 13.0–17.0)
MCH: 30.6 pg (ref 26.0–34.0)
MCHC: 34.2 g/dL (ref 30.0–36.0)
MCV: 89.4 fL (ref 80.0–100.0)
Platelets: 143 10*3/uL — ABNORMAL LOW (ref 150–400)
RBC: 3.86 MIL/uL — ABNORMAL LOW (ref 4.22–5.81)
RDW: 12.6 % (ref 11.5–15.5)
WBC: 5.3 10*3/uL (ref 4.0–10.5)
nRBC: 0 % (ref 0.0–0.2)

## 2023-01-25 LAB — CREATININE, SERUM
Creatinine, Ser: 1.92 mg/dL — ABNORMAL HIGH (ref 0.61–1.24)
GFR, Estimated: 33 mL/min — ABNORMAL LOW (ref >60)

## 2023-01-25 LAB — BRAIN NATRIURETIC PEPTIDE: B Natriuretic Peptide: 17.8 pg/mL (ref 0.0–100.0)

## 2023-01-25 LAB — GLUCOSE, CAPILLARY
Glucose-Capillary: 176 mg/dL — ABNORMAL HIGH (ref 70–99)
Glucose-Capillary: 157 mg/dL — ABNORMAL HIGH (ref 70–99)

## 2023-01-25 LAB — MRSA NEXT GEN BY PCR, NASAL: MRSA by PCR Next Gen: DETECTED — AB

## 2023-01-25 MED ORDER — DOCUSATE SODIUM 100 MG PO CAPS
100.0000 mg | ORAL_CAPSULE | Freq: Two times a day (BID) | ORAL | Status: DC
  Administered 2023-01-25 – 2023-01-29 (×8): 100 mg via ORAL
  Filled 2023-01-25 (×9): qty 1

## 2023-01-25 MED ORDER — QUETIAPINE FUMARATE 100 MG PO TABS
100.0000 mg | ORAL_TABLET | Freq: Every day | ORAL | Status: DC
  Administered 2023-01-25 – 2023-01-28 (×4): 100 mg via ORAL
  Filled 2023-01-25 (×4): qty 1

## 2023-01-25 MED ORDER — ALBUTEROL SULFATE HFA 108 (90 BASE) MCG/ACT IN AERS: 2.0000 | INHALATION_SPRAY | RESPIRATORY_TRACT | Status: DC | PRN

## 2023-01-25 MED ORDER — DONEPEZIL HCL 10 MG PO TABS
10.0000 mg | ORAL_TABLET | Freq: Every day | ORAL | Status: DC
  Administered 2023-01-25 – 2023-01-28 (×4): 10 mg via ORAL
  Filled 2023-01-25 (×4): qty 1

## 2023-01-25 MED ORDER — ACETAMINOPHEN 650 MG RE SUPP: 650.0000 mg | Freq: Four times a day (QID) | RECTAL | Status: DC | PRN

## 2023-01-25 MED ORDER — ALFUZOSIN HCL ER 10 MG PO TB24
10.0000 mg | ORAL_TABLET | Freq: Every day | ORAL | Status: DC
  Administered 2023-01-26 – 2023-01-29 (×4): 10 mg via ORAL
  Filled 2023-01-25 (×4): qty 1

## 2023-01-25 MED ORDER — ACETAMINOPHEN 325 MG PO TABS: 650.0000 mg | ORAL_TABLET | Freq: Four times a day (QID) | ORAL | Status: DC | PRN

## 2023-01-25 MED ORDER — ALBUTEROL SULFATE (2.5 MG/3ML) 0.083% IN NEBU: 2.5000 mg | INHALATION_SOLUTION | RESPIRATORY_TRACT | Status: DC | PRN

## 2023-01-25 MED ORDER — SODIUM CHLORIDE 0.9 % IV SOLN
3.0000 g | Freq: Two times a day (BID) | INTRAVENOUS | Status: DC
  Administered 2023-01-25 – 2023-01-27 (×4): 3 g via INTRAVENOUS
  Filled 2023-01-25 (×4): qty 8

## 2023-01-25 MED ORDER — ENOXAPARIN SODIUM 30 MG/0.3ML IJ SOSY
30.0000 mg | PREFILLED_SYRINGE | INTRAMUSCULAR | Status: DC
  Administered 2023-01-26 – 2023-01-27 (×2): 30 mg via SUBCUTANEOUS
  Filled 2023-01-25 (×2): qty 0.3

## 2023-01-25 MED ORDER — ASPIRIN 81 MG PO TBEC
81.0000 mg | DELAYED_RELEASE_TABLET | Freq: Every day | ORAL | Status: DC
  Administered 2023-01-26 – 2023-01-29 (×4): 81 mg via ORAL
  Filled 2023-01-25 (×4): qty 1

## 2023-01-25 MED ORDER — POLYETHYLENE GLYCOL 3350 17 G PO PACK
17.0000 g | PACK | Freq: Every day | ORAL | Status: DC | PRN
  Administered 2023-01-26 – 2023-01-27 (×2): 17 g via ORAL
  Filled 2023-01-25 (×2): qty 1

## 2023-01-25 MED ORDER — GABAPENTIN 300 MG PO CAPS
900.0000 mg | ORAL_CAPSULE | Freq: Every day | ORAL | Status: DC
  Administered 2023-01-25 – 2023-01-28 (×4): 900 mg via ORAL
  Filled 2023-01-25 (×4): qty 3

## 2023-01-25 MED ORDER — PRAVASTATIN SODIUM 40 MG PO TABS
40.0000 mg | ORAL_TABLET | Freq: Every day | ORAL | Status: DC
  Administered 2023-01-26 – 2023-01-28 (×3): 40 mg via ORAL
  Filled 2023-01-25 (×3): qty 1

## 2023-01-25 MED ORDER — DSS 100 MG PO CAPS: 100.0000 mg | ORAL_CAPSULE | Freq: Two times a day (BID) | ORAL | Status: DC

## 2023-01-25 MED ORDER — AMLODIPINE BESYLATE 10 MG PO TABS
5.0000 mg | ORAL_TABLET | Freq: Every day | ORAL | Status: DC
  Administered 2023-01-26 – 2023-01-29 (×4): 5 mg via ORAL
  Filled 2023-01-25 (×4): qty 1

## 2023-01-25 MED ORDER — ENSURE ENLIVE PO LIQD
237.0000 mL | Freq: Two times a day (BID) | ORAL | Status: DC
  Administered 2023-01-26 – 2023-01-29 (×5): 237 mL via ORAL

## 2023-01-25 MED ORDER — INSULIN ASPART 100 UNIT/ML IJ SOLN: 0.0000 [IU] | Freq: Every day | INTRAMUSCULAR | Status: DC

## 2023-01-25 MED ORDER — SODIUM CHLORIDE 0.9% FLUSH
3.0000 mL | Freq: Two times a day (BID) | INTRAVENOUS | Status: DC
  Administered 2023-01-26 – 2023-01-29 (×3): 3 mL via INTRAVENOUS

## 2023-01-25 MED ORDER — ENOXAPARIN SODIUM 40 MG/0.4ML IJ SOSY: 40.0000 mg | PREFILLED_SYRINGE | INTRAMUSCULAR | Status: DC

## 2023-01-25 MED ORDER — SODIUM CHLORIDE 0.9 % IV SOLN: INTRAVENOUS | Status: DC

## 2023-01-25 MED ORDER — INSULIN ASPART 100 UNIT/ML IJ SOLN
0.0000 [IU] | Freq: Three times a day (TID) | INTRAMUSCULAR | Status: DC
  Administered 2023-01-25: 3 [IU] via SUBCUTANEOUS
  Administered 2023-01-26: 2 [IU] via SUBCUTANEOUS
  Administered 2023-01-27: 3 [IU] via SUBCUTANEOUS
  Administered 2023-01-27: 2 [IU] via SUBCUTANEOUS
  Administered 2023-01-28: 5 [IU] via SUBCUTANEOUS

## 2023-01-25 MED ORDER — QUETIAPINE FUMARATE 25 MG PO TABS
25.0000 mg | ORAL_TABLET | Freq: Every day | ORAL | Status: DC
  Administered 2023-01-26 – 2023-01-29 (×4): 25 mg via ORAL
  Filled 2023-01-25 (×4): qty 1

## 2023-01-25 NOTE — ED Notes (Signed)
[facility] at bedside to assume care.

## 2023-01-25 NOTE — Progress Notes (Signed)
Pharmacy Antibiotic Note  Philip Schultz is a 89 y.o. male admitted on 01/25/2023 with aspiration pneumonia.  Pharmacy has been consulted for Unasyn dosing.  Plan: Unasyn 3g IV q12h  Follow up renal function, culture results, and clinical course.   Height: 6' (182.9 cm) Weight: 77.1 kg (170 lb) IBW/kg (Calculated) : 77.6  Temp (24hrs), Avg:98.2 F (36.8 C), Min:98 F (36.7 C), Max:98.6 F (37 C)  Recent Labs  Lab 01/25/23 1132  WBC 4.7  CREATININE 1.80*    Estimated Creatinine Clearance: 30.3 mL/min (A) (by C-G formula based on SCr of 1.8 mg/dL (H)).    No Known Allergies  Antimicrobials this admission: 7/19 Unasyn >>   Microbiology results: none  Thank you for allowing pharmacy to be a part of this patient's care.  Lynann Beaver PharmD, BCPS [facility] main pharmacy [phone] 01/25/2023 5:33 PM

## 2023-01-25 NOTE — ED Triage Notes (Signed)
Ray Haseman (son) at bedside reports at 0900 today patient started choking on cholate milk at breakfast resulting in difficulty breathing and dyspnea at rest. Patient A+O x3, VSS, O2 95% on 6L HFNC w/non-productive cough, NAD noted.

## 2023-01-25 NOTE — ED Notes (Signed)
Called [facility] for transport to [facility] @1 :47.  Spoke with [employer]

## 2023-01-25 NOTE — H&P (Signed)
History and Physical    Patient: Philip Schultz ZOX:[id number] DOB: 1932/02/04 DOA: 01/25/2023 DOS: the patient was seen and examined on 01/25/2023 PCP: Selina Cooley, MD  Patient coming from: Home  Chief Complaint:  Chief Complaint  Patient presents with   Shortness of Breath   HPI: Philip Schultz is a 89 y.o. male with medical history significant of dementia.  Patient therefore cannot give any history of presenting illness.  History is obtained from chart review as well as well as from the signout.  Patient was apparently in his usual state of health till approximately this time this a.m. when he had a witnessed episode of aspirating on chocolate milk.  Subsequently patient was noted to have shortness of breath and get more confused/disoriented.  Patient seemed "out of it ".  Patient was initially brought to outside ER evaluated by the ER attending and found to be hypoxic to 84% on room air with rhonchorous breath sounds and wet cough.  Patient is now requiring 4 L/min of supplementary oxygen and is evaluated at [facility] long progressive care unit.  Patient offers no complaints, feels great still on 4 L/min of supplementary oxygen unable to recall events of the day.  Reports no chest pain, currently not coughing reports no shortness of breath feels energetic. Review of Systems: unable to review all systems due to the inability of the patient to answer questions. Past Medical History:  Diagnosis Date   Alzheimer disease (HCC)    CKD (chronic kidney disease)    Coronary artery disease    Diabetes mellitus without complication (HCC)    Hypertension    TIA (transient ischemic attack)    Past Surgical History:  Procedure Laterality Date   ANGIOPLASTY     ESOPHAGEAL DILATION     Social History:  reports that he has quit smoking. His smoking use included cigarettes. He has never used smokeless tobacco. He reports current alcohol use. He reports that he does not use drugs.  No Known  Allergies  History reviewed. No pertinent family history.  Prior to Admission medications   Medication Sig Start Date End Date Taking? Authorizing Provider  alfuzosin (UROXATRAL) 10 MG 24 hr tablet Take 10 mg by mouth daily. 02/28/22  Yes [provider]  amLODipine (NORVASC) 5 MG tablet Take 5 mg by mouth daily. 02/21/22  Yes [provider]  aspirin EC 81 MG tablet Take 81 mg by mouth daily.   Yes [provider]  Docusate Sodium (DSS) 100 MG CAPS Take 100 mg by mouth 2 (two) times daily.   Yes [provider]  donepezil (ARICEPT) 10 MG tablet Take 10 mg by mouth at bedtime. 07/30/22  Yes [provider]  gabapentin (NEURONTIN) 300 MG capsule Take 900 mg by mouth at bedtime. 06/06/22  Yes [provider]  lovastatin (MEVACOR) 40 MG tablet Take 40 mg by mouth at bedtime. 08/07/22  Yes [provider]  metFORMIN (GLUCOPHAGE) 500 MG tablet Take 500 mg by mouth daily. 06/18/22  Yes [provider]  QUEtiapine (SEROQUEL) 100 MG tablet Take 100 mg by mouth at bedtime. 08/20/22  Yes [provider]  QUEtiapine (SEROQUEL) 25 MG tablet Take 25 mg by mouth daily.   Yes [provider]    Physical Exam: Vitals:   01/25/23 1500 01/25/23 1530 01/25/23 1600 01/25/23 1656  BP: 128/68 132/61 131/69 (!) 127/55  Pulse: 82 (!) 105 96 86  Resp: 18 19 18 20   Temp:    98.6  F (37 C)  TempSrc:    Oral  SpO2: 96% 96% 96% 92%  Weight:      Height:       Neuro: Alert awake, interacting, appropriately, however cannot recall events of the day.  No distress apparent Respiratory; bilateral intravesicular on 4 L/min of supplementary oxygen Cardiovascular exam S1-S2 normal Abdomen soft nontender Extremities warm without edema Data Reviewed:  Labs on Admission:  Results for orders placed or performed during the hospital encounter of 01/25/23 (from the past 24 hour(s))  CBC with Differential     Status: Abnormal    Collection Time: 01/25/23 11:32 AM  Result Value Ref Range   WBC 4.7 4.0 - 10.5 K/uL   RBC 4.39 4.22 - 5.81 MIL/uL   Hemoglobin 13.3 13.0 - 17.0 g/dL   HCT 16.1 (L) 09.6 - 04.5 %   MCV 86.6 80.0 - 100.0 fL   MCH 30.3 26.0 - 34.0 pg   MCHC 35.0 30.0 - 36.0 g/dL   RDW 40.9 81.1 - 91.4 %   Platelets 166 150 - 400 K/uL   nRBC 0.0 0.0 - 0.2 %   Neutrophils Relative % 73 %   Neutro Abs 3.4 1.7 - 7.7 K/uL   Lymphocytes Relative 13 %   Lymphs Abs 0.6 (L) 0.7 - 4.0 K/uL   Monocytes Relative 5 %   Monocytes Absolute 0.2 0.1 - 1.0 K/uL   Eosinophils Relative 0 %   Eosinophils Absolute 0.0 0.0 - 0.5 K/uL   Basophils Relative 1 %   Basophils Absolute 0.0 0.0 - 0.1 K/uL   WBC Morphology Mild Left Shift (1-5% metas, occ myelo)    RBC Morphology MORPHOLOGY UNREMARKABLE    Smear Review Normal platelet morphology    Immature Granulocytes 8 %   Abs Immature Granulocytes 0.39 (H) 0.00 - 0.07 K/uL  Comprehensive metabolic panel     Status: Abnormal   Collection Time: 01/25/23 11:32 AM  Result Value Ref Range   Sodium 132 (L) 135 - 145 mmol/L   Potassium 4.1 3.5 - 5.1 mmol/L   Chloride 100 98 - 111 mmol/L   CO2 22 22 - 32 mmol/L   Glucose, Bld 287 (H) 70 - 99 mg/dL   BUN 30 (H) 8 - 23 mg/dL   Creatinine, Ser 7.82 (H) 0.61 - 1.24 mg/dL   Calcium 9.0 8.9 - 95.6 mg/dL   Total Protein 7.1 6.5 - 8.1 g/dL   Albumin 3.3 (L) 3.5 - 5.0 g/dL   AST 15 15 - 41 U/L   ALT 10 0 - 44 U/L   Alkaline Phosphatase 49 38 - 126 U/L   Total Bilirubin 0.4 0.3 - 1.2 mg/dL   GFR, Estimated 36 (L) >60 mL/min   Anion gap 10 5 - 15  I-Stat venous blood gas, ([facility], [facility], [facility])     Status: Abnormal   Collection Time: 01/25/23 11:58 AM  Result Value Ref Range   pH, Ven 7.346 7.25 - 7.43   pCO2, Ven 47.8 44 - 60 mmHg   pO2, Ven 23 (LL) 32 - 45 mmHg   Bicarbonate 26.2 20.0 - 28.0 mmol/L   TCO2 28 22 - 32 mmol/L   O2 Saturation 37 %   Acid-Base Excess 0.0 0.0 - 2.0 mmol/L   Sodium 137 135 - 145 mmol/L   Potassium 4.2  3.5 - 5.1 mmol/L   Calcium, Ion 1.25 1.15 - 1.40 mmol/L   HCT 41.0 39.0 - 52.0 %   Hemoglobin 13.9 13.0 - 17.0  g/dL   Collection site IV start    Drawn by Operator    Sample type VENOUS    Comment VALUES EXPECTED, NO REPEAT   Brain natriuretic peptide     Status: None   Collection Time: 01/25/23 12:00 PM  Result Value Ref Range   B Natriuretic Peptide 17.8 0.0 - 100.0 pg/mL   Basic Metabolic Panel: Recent Labs  Lab 01/25/23 1132 01/25/23 1158  NA 132* 137  K 4.1 4.2  CL 100  --   CO2 22  --   GLUCOSE 287*  --   BUN 30*  --   CREATININE 1.80*  --   CALCIUM 9.0  --    Liver Function Tests: Recent Labs  Lab 01/25/23 1132  AST 15  ALT 10  ALKPHOS 49  BILITOT 0.4  PROT 7.1  ALBUMIN 3.3*   No results for input(s): "LIPASE", "AMYLASE" in the last 168 hours. No results for input(s): "AMMONIA" in the last 168 hours. CBC: Recent Labs  Lab 01/25/23 1132 01/25/23 1158  WBC 4.7  --   NEUTROABS 3.4  --   HGB 13.3 13.9  HCT 38.0* 41.0  MCV 86.6  --   PLT 166  --    Cardiac Enzymes: No results for input(s): "CKTOTAL", "CKMB", "CKMBINDEX", "TROPONINIHS" in the last 168 hours.  BNP (last 3 results) No results for input(s): "PROBNP" in the last 8760 hours. CBG: No results for input(s): "GLUCAP" in the last 168 hours.  Radiological Exams on Admission:  DG Chest Portable 1 View  Result Date: 01/25/2023 CLINICAL DATA:  Hypoxia. EXAM: PORTABLE CHEST 1 VIEW COMPARISON:  06/19/2022. FINDINGS: Low lung volumes accentuate the pulmonary vasculature and cardiomediastinal silhouette. Reticular opacities in the lung bases, favored to reflect aspiration. No pleural effusion or pneumothorax. IMPRESSION: Reticular opacities in the lung bases, favored to reflect aspiration. Electronically Signed   By: Orvan Falconer M.D.   On: 01/25/2023 12:08    EKG: pending.   Assessment and Plan: * Aspiration pneumonitis (HCC) Rule out pneumonia especially given that patient may not report  symptoms in a timely manner.  Associated with hypoxia, requiring 4 L/min of supplementary oxygen.  Continue with same.  Treat with Unasyn.  Till hypoxia resolved.  Type 2 diabetes mellitus with kidney complication, without long-term current use of insulin (HCC) Hyperglycemia noted on labs currently.  Maintain on insulin.  May be due to stress of acute illness.  Monitor glucose      Advance Care Planning:   Code Status: Not on file full code by default.  Consults: none  Family Communication: family aware of aptient admission.  Severity of Illness: The appropriate patient status for this patient is INPATIENT. Inpatient status is judged to be reasonable and necessary in order to provide the required intensity of service to ensure the patient's safety. The patient's presenting symptoms, physical exam findings, and initial radiographic and laboratory data in the context of their chronic comorbidities is felt to place them at high risk for further clinical deterioration. Furthermore, it is not anticipated that the patient will be medically stable for discharge from the hospital within 2 midnights of admission.   * I certify that at the point of admission it is my clinical judgment that the patient will require inpatient hospital care spanning beyond 2 midnights from the point of admission due to high intensity of service, high risk for further deterioration and high frequency of surveillance required.*  Author: Nolberto Hanlon, MD 01/25/2023 5:21 PM  For on  call review [email or web address].

## 2023-01-25 NOTE — ED Notes (Signed)
   01/25/23 1123  Respiratory Assessment  $ RT Protocol Assessment  Yes  Assessment Type Assess only  Respiratory Pattern Regular;Unlabored;Symmetrical  Chest Assessment Chest expansion symmetrical  Cough Non-productive (wet)  Bilateral Breath Sounds Diminished;Rhonchi  Oxygen Therapy/Pulse Ox  O2 Device Nasal Cannula  O2 Therapy Oxygen  O2 Flow Rate (L/min) 3 L/min  SpO2 (!) 84 %   Patient seen in triage, BBS rhonchi on Portal @ 3lpm.  Placed on NRB mask Spo2 increased to 97%. Weaned to 6lpm [place] in room 7 Spo2 94%.

## 2023-01-25 NOTE — ED Notes (Signed)
ED Provider at bedside. 

## 2023-01-25 NOTE — ED Notes (Signed)
Helped pt urinate, found him at the end of bed, with every line stretched tight, needing to pee.  Pt did not make the urinal, but I assisted him to finish and then got him changed, situated safely back in bed, with new warm blankets. Pt 'feels better now.'

## 2023-01-25 NOTE — Assessment & Plan Note (Signed)
Hyperglycemia noted on labs currently.  Maintain on insulin.  May be due to stress of acute illness.  Monitor glucose

## 2023-01-25 NOTE — ED Notes (Signed)
ED TO INPATIENT HANDOFF REPORT  ED Nurse Name and Phone #: Ina Kick RN [phone]  S Name/Age/Gender Philip Schultz 89 y.o. male Room/Bed: MH07/MH07  Code Status   Code Status: Not on file  Home/SNF/Other Skilled nursing facility Patient oriented to: self, place, and time Is this baseline? Yes   Triage Complete: Triage complete  Chief Complaint Aspiration pneumonitis (HCC) [J69.0]  Triage Note Ray Seifert (son) at bedside reports at 0900 today patient started choking on cholate milk at breakfast resulting in difficulty breathing and dyspnea at rest. Patient A+O x3, VSS, O2 95% on 6L HFNC w/non-productive cough, NAD noted.    Allergies No Known Allergies  Level of Care/Admitting Diagnosis ED Disposition     ED Disposition  Admit   Condition  --   Comment  Hospital Area: [facility] [[id number]]  Level of Care: Progressive [102]  Admit to Progressive based on following criteria: RESPIRATORY PROBLEMS hypoxemic/hypercapnic respiratory failure that is responsive to NIPPV (BiPAP) or High Flow Nasal Cannula (6-80 lpm). Frequent assessment/intervention, no > Q2 hrs < Q4 hrs, to maintain oxygenation and pulmonary hygiene.  Interfacility transfer: Yes  May place patient in observation at [facility] or [facility] if equivalent level of care is available:: Yes  Covid Evaluation: Asymptomatic - no recent exposure (last 10 days) testing not required  Diagnosis: Aspiration pneumonitis [facility]) [[id number]]  Admitting Physician: Maryln Gottron [[id number]]  Attending Physician: Kirby Crigler, MIR M [[id number]]          B Medical/Surgery History Past Medical History:  Diagnosis Date   Alzheimer disease (HCC)    CKD (chronic kidney disease)    Coronary artery disease    Diabetes mellitus without complication (HCC)    Hypertension    TIA (transient ischemic attack)    Past Surgical History:  Procedure Laterality Date   ANGIOPLASTY     ESOPHAGEAL DILATION        A IV Location/Drains/Wounds Patient Lines/Drains/Airways Status     Active Line/Drains/Airways     Name Placement date Placement time Site Days   Peripheral IV 01/25/23 20 G Left Antecubital 01/25/23  1152  Antecubital  less than 1            Intake/Output Last 24 hours No intake or output data in the 24 hours ending 01/25/23 1420  Labs/Imaging Results for orders placed or performed during the hospital encounter of 01/25/23 (from the past 48 hour(s))  CBC with Differential     Status: Abnormal   Collection Time: 01/25/23 11:32 AM  Result Value Ref Range   WBC 4.7 4.0 - 10.5 K/uL   RBC 4.39 4.22 - 5.81 MIL/uL   Hemoglobin 13.3 13.0 - 17.0 g/dL   HCT 56.4 (L) 33.2 - 95.1 %   MCV 86.6 80.0 - 100.0 fL   MCH 30.3 26.0 - 34.0 pg   MCHC 35.0 30.0 - 36.0 g/dL   RDW 88.4 16.6 - 06.3 %   Platelets 166 150 - 400 K/uL   nRBC 0.0 0.0 - 0.2 %   Neutrophils Relative % 73 %   Neutro Abs 3.4 1.7 - 7.7 K/uL   Lymphocytes Relative 13 %   Lymphs Abs 0.6 (L) 0.7 - 4.0 K/uL   Monocytes Relative 5 %   Monocytes Absolute 0.2 0.1 - 1.0 K/uL   Eosinophils Relative 0 %   Eosinophils Absolute 0.0 0.0 - 0.5 K/uL   Basophils Relative 1 %   Basophils Absolute 0.0 0.0 - 0.1 K/uL  WBC Morphology Mild Left Shift (1-5% metas, occ myelo)    RBC Morphology MORPHOLOGY UNREMARKABLE    Smear Review Normal platelet morphology    Immature Granulocytes 8 %   Abs Immature Granulocytes 0.39 (H) 0.00 - 0.07 K/uL    Comment: Performed at [facility], [address] [place], [place] [address]  Comprehensive metabolic panel     Status: Abnormal   Collection Time: 01/25/23 11:32 AM  Result Value Ref Range   Sodium 132 (L) 135 - 145 mmol/L   Potassium 4.1 3.5 - 5.1 mmol/L   Chloride 100 98 - 111 mmol/L   CO2 22 22 - 32 mmol/L   Glucose, Bld 287 (H) 70 - 99 mg/dL    Comment: Glucose reference range applies only to samples taken after fasting for at least 8 hours.   BUN 30 (H) 8 - 23 mg/dL    Creatinine, Ser 1.91 (H) 0.61 - 1.24 mg/dL   Calcium 9.0 8.9 - 47.8 mg/dL   Total Protein 7.1 6.5 - 8.1 g/dL   Albumin 3.3 (L) 3.5 - 5.0 g/dL   AST 15 15 - 41 U/L   ALT 10 0 - 44 U/L   Alkaline Phosphatase 49 38 - 126 U/L   Total Bilirubin 0.4 0.3 - 1.2 mg/dL   GFR, Estimated 36 (L) >60 mL/min    Comment: (NOTE) Calculated using the CKD-EPI Creatinine Equation (2021)    Anion gap 10 5 - 15    Comment: Performed at [facility], [address] [place], [place] [address]  I-Stat venous blood gas, ([facility], [facility], [facility])     Status: Abnormal   Collection Time: 01/25/23 11:58 AM  Result Value Ref Range   pH, Ven 7.346 7.25 - 7.43   pCO2, Ven 47.8 44 - 60 mmHg   pO2, Ven 23 (LL) 32 - 45 mmHg   Bicarbonate 26.2 20.0 - 28.0 mmol/L   TCO2 28 22 - 32 mmol/L   O2 Saturation 37 %   Acid-Base Excess 0.0 0.0 - 2.0 mmol/L   Sodium 137 135 - 145 mmol/L   Potassium 4.2 3.5 - 5.1 mmol/L   Calcium, Ion 1.25 1.15 - 1.40 mmol/L   HCT 41.0 39.0 - 52.0 %   Hemoglobin 13.9 13.0 - 17.0 g/dL   Collection site IV start    Drawn by Operator    Sample type VENOUS    Comment VALUES EXPECTED, NO REPEAT   Brain natriuretic peptide     Status: None   Collection Time: 01/25/23 12:00 PM  Result Value Ref Range   B Natriuretic Peptide 17.8 0.0 - 100.0 pg/mL    Comment: Performed at [facility], [address] [place], [place] [address]   DG Chest Portable 1 View  Result Date: 01/25/2023 CLINICAL DATA:  Hypoxia. EXAM: PORTABLE CHEST 1 VIEW COMPARISON:  06/19/2022. FINDINGS: Low lung volumes accentuate the pulmonary vasculature and cardiomediastinal silhouette. Reticular opacities in the lung bases, favored to reflect aspiration. No pleural effusion or pneumothorax. IMPRESSION: Reticular opacities in the lung bases, favored to reflect aspiration. Electronically Signed   By: Orvan Falconer M.D.   On: 01/25/2023 12:08    Pending Labs Unresulted Labs (From admission, onward)    None        Vitals/Pain Today's Vitals   01/25/23 1200 01/25/23 1300 01/25/23 1330 01/25/23 1400  BP: (!) 147/62 135/62 139/67 119/62  Pulse: 82 79 78 81  Resp: 14 15 20  20  Temp:   98 F (36.7 C)   TempSrc:      SpO2: 95% 91% 92% 94%  Weight:      Height:      PainSc:        Isolation Precautions No active isolations  Medications Medications  albuterol (VENTOLIN HFA) 108 (90 Base) MCG/ACT inhaler 2 puff (has no administration in time range)    Mobility non-ambulatory     Focused Assessments VSS, NAD noted   R Recommendations: See Admitting Provider Note  Report given to:   Additional Notes:

## 2023-01-25 NOTE — ED Provider Notes (Signed)
[facility] Provider Note   CSN: [id number] Arrival date & time: 01/25/23  1103     History  Chief Complaint  Patient presents with   Shortness of Breath    Philip Schultz is a 89 y.o. male with PMH as listed below, relevant dementia, HTN, HLD, T2DM, CKD, AS, 2nd degree AV block, BPH, recurrent syncope who presents with SOB that began after an episode of aspiration on chocolate milk at breakfast this AM. Prior to that was in his NSOH, no complaints, cough, SOB, illnesses f/c. A couple hours after the aspiration event his son Rosalia Hammers noticed that he was having dyspnea, disorientation. Patient seemed "out of it." He is c/o SOB but denies CP. In triage had rhoncorous breath sounds and wet cough, satting 84% on RA.    Past Medical History:  Diagnosis Date   Alzheimer disease (HCC)    CKD (chronic kidney disease)    Coronary artery disease    Diabetes mellitus without complication (HCC)    Hypertension    TIA (transient ischemic attack)        Home Medications Prior to Admission medications   Medication Sig Start Date End Date Taking? Authorizing Provider  alfuzosin (UROXATRAL) 10 MG 24 hr tablet Take 10 mg by mouth daily. 02/28/22  Yes [provider]  amLODipine (NORVASC) 5 MG tablet Take 5 mg by mouth daily. 02/21/22  Yes [provider]  aspirin EC 81 MG tablet Take 81 mg by mouth daily.   Yes [provider]  Docusate Sodium (DSS) 100 MG CAPS Take 100 mg by mouth 2 (two) times daily.   Yes [provider]  donepezil (ARICEPT) 10 MG tablet Take 10 mg by mouth at bedtime. 07/30/22  Yes [provider]  gabapentin (NEURONTIN) 300 MG capsule Take 900 mg by mouth at bedtime. 06/06/22  Yes [provider]  lovastatin (MEVACOR) 40 MG tablet Take 40 mg by mouth at bedtime. 08/07/22  Yes [provider]  metFORMIN (GLUCOPHAGE) 500 MG tablet Take 500 mg by mouth daily. 06/18/22  Yes  [provider]  QUEtiapine (SEROQUEL) 100 MG tablet Take 100 mg by mouth at bedtime. 08/20/22  Yes [provider]  QUEtiapine (SEROQUEL) 25 MG tablet Take 25 mg by mouth daily.   Yes [provider]      Allergies    Patient has no known allergies.    Review of Systems   Review of Systems A 10 point review of systems was performed and is negative unless otherwise reported in HPI.  Physical Exam Updated Vital Signs BP 139/67   Pulse 78   Temp 98 F (36.7 C)   Resp 20   Ht 6' (1.829 m)   Wt 77.1 kg   SpO2 92%   BMI 23.06 kg/m  Physical Exam General: Normal appearing male, lying in bed.  HEENT: Sclera anicteric, MMM, trachea midline.  Cardiology: RRR, no murmurs/rubs/gallops.  Resp: Normal respiratory rate and effort. CTAB, no wheezes, rhonchi, crackles.  Abd: Soft, non-tender, non-distended. No rebound tenderness or guarding.  GU: Deferred. MSK: No peripheral edema or signs of trauma. Extremities without deformity or TTP. No cyanosis or clubbing. Skin: warm, dry.  Neuro: A&Ox4, CNs II-XII grossly intact. MAEs. Sensation grossly intact.  Psych: Normal mood and affect.   ED Results / Procedures / Treatments   Labs (all labs ordered are listed, but only abnormal results are displayed) Labs Reviewed  CBC WITH DIFFERENTIAL/PLATELET -  Abnormal; Notable for the following components:      Result Value   HCT 38.0 (*)    Lymphs Abs 0.6 (*)    Abs Immature Granulocytes 0.39 (*)    All other components within normal limits  COMPREHENSIVE METABOLIC PANEL - Abnormal; Notable for the following components:   Sodium 132 (*)    Glucose, Bld 287 (*)    BUN 30 (*)    Creatinine, Ser 1.80 (*)    Albumin 3.3 (*)    GFR, Estimated 36 (*)    All other components within normal limits  I-STAT VENOUS BLOOD GAS, ED - Abnormal; Notable for the following components:   pO2, Ven 23 (*)    All other components within normal limits  BRAIN NATRIURETIC PEPTIDE     EKG None  Radiology DG Chest Portable 1 View  Result Date: 01/25/2023 CLINICAL DATA:  Hypoxia. EXAM: PORTABLE CHEST 1 VIEW COMPARISON:  06/19/2022. FINDINGS: Low lung volumes accentuate the pulmonary vasculature and cardiomediastinal silhouette. Reticular opacities in the lung bases, favored to reflect aspiration. No pleural effusion or pneumothorax. IMPRESSION: Reticular opacities in the lung bases, favored to reflect aspiration. Electronically Signed   By: Orvan Falconer M.D.   On: 01/25/2023 12:08    Procedures Procedures    Medications Ordered in ED Medications  albuterol (VENTOLIN HFA) 108 (90 Base) MCG/ACT inhaler 2 puff (has no administration in time range)    ED Course/ Medical Decision Making/ A&P                          Medical Decision Making Amount and/or Complexity of Data Reviewed Labs: ordered. Radiology: ordered. Decision-making details documented in ED Course.  Risk Prescription drug management. Decision regarding hospitalization.    This patient presents to the ED for concern of SOB/hypoxia, this involves an extensive number of treatment options, and is a complaint that carries with it a high risk of complications and morbidity.  I considered the following differential and admission for this acute, potentially life threatening condition.   MDM:    Pt likely w/ aspiration pneumonitis in s/o witnessed aspiration event and hypoxia. Was previously in his NSOH with no cough/SOB. Now has wet cough and requiring 6L HHFNC. Also possible he is manifesting PNA or bronchitis but so acutely after aspiration more likely aspiration pneumonitis. No CP to indicate ACS/PE. No leg swelling on exam to indicate pulm edema/fluid overload. By the time I see the patient he is already on [facility], resp therapist states he has been coughing, and his lung sounds are clear but were reportedly rhoncorous in triage. Patient states he feels much better on the oxygen and son states his  mental status has improved. This "out of it" episode was likely hypoxia. VBG doesn't show any hypercarbia. CXR indicating aspiration.   Clinical Course as of 01/25/23 1340  Fri Jan 25, 2023  1319 DG Chest Portable 1 View Reticular opacities in the lung bases, favored to reflect aspiration. CBC, CMP unremarkable in context of presentation. BNP neg. VBG w/ no hypercarbia. Does have low oxygen but venous sample.  [HN]  1320 Consulted to hospitalist for admission for aspiration pneumonitis. [HN]    Clinical Course User Index [HN] Loetta Rough, MD    Labs: I Ordered, and personally interpreted labs.  The pertinent results include:  those listed above  Imaging Studies ordered: I ordered imaging studies including CXR I independently visualized and interpreted imaging. I agree with the radiologist  interpretation  Additional history obtained from chart review, son at bedside.    Cardiac Monitoring: The patient was maintained on a cardiac monitor.  I personally viewed and interpreted the cardiac monitored which showed an underlying rhythm of: NSR  Reevaluation: After the interventions noted above, I reevaluated the patient and found that they have :stayed the same  Disposition:  Admit  Co morbidities that complicate the patient evaluation  Past Medical History:  Diagnosis Date   Alzheimer disease (HCC)    CKD (chronic kidney disease)    Coronary artery disease    Diabetes mellitus without complication (HCC)    Hypertension    TIA (transient ischemic attack)      Medicines Meds ordered this encounter  Medications   albuterol (VENTOLIN HFA) 108 (90 Base) MCG/ACT inhaler 2 puff    I have reviewed the patients home medicines and have made adjustments as needed  Problem List / ED Course: Problem List Items Addressed This Visit   None Visit Diagnoses     Aspiration pneumonitis (HCC)    -  Primary   Acute hypoxic respiratory failure (HCC)                        This note was created using dictation software, which may contain spelling or grammatical errors.    Loetta Rough, MD 01/25/23 1340

## 2023-01-25 NOTE — Assessment & Plan Note (Signed)
Rule out pneumonia especially given that patient may not report symptoms in a timely manner.  Associated with hypoxia, requiring 4 L/min of supplementary oxygen.  Continue with same.  Treat with Unasyn.  Till hypoxia resolved.

## 2023-01-26 DIAGNOSIS — J690 Pneumonitis due to inhalation of food and vomit: Secondary | ICD-10-CM | POA: Diagnosis not present

## 2023-01-26 LAB — CBC
HCT: 30.9 % — ABNORMAL LOW (ref 39.0–52.0)
Hemoglobin: 10.4 g/dL — ABNORMAL LOW (ref 13.0–17.0)
MCH: 30.6 pg (ref 26.0–34.0)
MCHC: 33.7 g/dL (ref 30.0–36.0)
MCV: 90.9 fL (ref 80.0–100.0)
Platelets: 137 10*3/uL — ABNORMAL LOW (ref 150–400)
RBC: 3.4 MIL/uL — ABNORMAL LOW (ref 4.22–5.81)
RDW: 12.7 % (ref 11.5–15.5)
WBC: 6.1 10*3/uL (ref 4.0–10.5)
nRBC: 0 % (ref 0.0–0.2)

## 2023-01-26 LAB — BASIC METABOLIC PANEL
Anion gap: 7 (ref 5–15)
BUN: 34 mg/dL — ABNORMAL HIGH (ref 8–23)
CO2: 24 mmol/L (ref 22–32)
Calcium: 8.6 mg/dL — ABNORMAL LOW (ref 8.9–10.3)
Chloride: 107 mmol/L (ref 98–111)
Creatinine, Ser: 1.98 mg/dL — ABNORMAL HIGH (ref 0.61–1.24)
GFR, Estimated: 32 mL/min — ABNORMAL LOW (ref >60)
Glucose, Bld: 127 mg/dL — ABNORMAL HIGH (ref 70–99)
Potassium: 4.1 mmol/L (ref 3.5–5.1)
Sodium: 138 mmol/L (ref 135–145)

## 2023-01-26 LAB — TSH: TSH: 0.545 u[IU]/mL (ref 0.350–4.500)

## 2023-01-26 LAB — PROTIME-INR
INR: 1.2 (ref 0.8–1.2)
Prothrombin Time: 15.4 seconds — ABNORMAL HIGH (ref 11.4–15.2)

## 2023-01-26 LAB — APTT: aPTT: 33 seconds (ref 24–36)

## 2023-01-26 LAB — MAGNESIUM: Magnesium: 1.8 mg/dL (ref 1.7–2.4)

## 2023-01-26 LAB — GLUCOSE, CAPILLARY
Glucose-Capillary: 114 mg/dL — ABNORMAL HIGH (ref 70–99)
Glucose-Capillary: 150 mg/dL — ABNORMAL HIGH (ref 70–99)
Glucose-Capillary: 110 mg/dL — ABNORMAL HIGH (ref 70–99)
Glucose-Capillary: 141 mg/dL — ABNORMAL HIGH (ref 70–99)

## 2023-01-26 LAB — PROCALCITONIN: Procalcitonin: 0.98 ng/mL

## 2023-01-26 MED ORDER — MUPIROCIN 2 % EX OINT
1.0000 | TOPICAL_OINTMENT | Freq: Two times a day (BID) | CUTANEOUS | Status: DC
  Administered 2023-01-26 – 2023-01-29 (×8): 1 via NASAL
  Filled 2023-01-26: qty 22

## 2023-01-26 MED ORDER — CHLORHEXIDINE GLUCONATE CLOTH 2 % EX PADS
6.0000 | MEDICATED_PAD | Freq: Every day | CUTANEOUS | Status: DC
  Administered 2023-01-26 – 2023-01-29 (×4): 6 via TOPICAL

## 2023-01-26 NOTE — Plan of Care (Signed)
  Problem: Education: Goal: Knowledge of General Education information will improve Description: Including pain rating scale, medication(s)/side effects and non-pharmacologic comfort measures Outcome: Progressing   Problem: Health Behavior/Discharge Planning: Goal: Ability to manage health-related needs will improve Outcome: Progressing   Problem: Clinical Measurements: Goal: Ability to maintain clinical measurements within normal limits will improve Outcome: Progressing Goal: Will remain free from infection Outcome: Progressing Goal: Diagnostic test results will improve Outcome: Progressing Goal: Respiratory complications will improve Outcome: Progressing Goal: Cardiovascular complication will be avoided Outcome: Progressing   Problem: Nutrition: Goal: Adequate nutrition will be maintained Outcome: Progressing   Problem: Coping: Goal: Level of anxiety will decrease Outcome: Progressing   Problem: Elimination: Goal: Will not experience complications related to bowel motility Outcome: Progressing Goal: Will not experience complications related to urinary retention Outcome: Progressing   Problem: Pain Managment: Goal: General experience of comfort will improve Outcome: Progressing   Problem: Safety: Goal: Ability to remain free from injury will improve Outcome: Progressing   Problem: Skin Integrity: Goal: Risk for impaired skin integrity will decrease Outcome: Progressing   Problem: Education: Goal: Ability to describe self-care measures that may prevent or decrease complications (Diabetes Survival Skills Education) will improve Outcome: Progressing Goal: Individualized Educational Video(s) Outcome: Progressing   Problem: Coping: Goal: Ability to adjust to condition or change in health will improve Outcome: Progressing   Problem: Fluid Volume: Goal: Ability to maintain a balanced intake and output will improve Outcome: Progressing   Problem: Health  Behavior/Discharge Planning: Goal: Ability to identify and utilize available resources and services will improve Outcome: Progressing Goal: Ability to manage health-related needs will improve Outcome: Progressing   Problem: Metabolic: Goal: Ability to maintain appropriate glucose levels will improve Outcome: Progressing   Problem: Nutritional: Goal: Maintenance of adequate nutrition will improve Outcome: Progressing Goal: Progress toward achieving an optimal weight will improve Outcome: Progressing   Problem: Skin Integrity: Goal: Risk for impaired skin integrity will decrease Outcome: Progressing   Problem: Tissue Perfusion: Goal: Adequacy of tissue perfusion will improve Outcome: Progressing

## 2023-01-26 NOTE — Evaluation (Signed)
Clinical/Bedside Swallow Evaluation Patient Details  Name: Philip Schultz MRN: [id number] Date of Birth: 1932/02/13  Today's Date: 01/26/2023 Time: SLP Start Time (ACUTE ONLY): 1212 SLP Stop Time (ACUTE ONLY): 1236 SLP Time Calculation (min) (ACUTE ONLY): 24 min  Past Medical History:  Past Medical History:  Diagnosis Date   Alzheimer disease (HCC)    CKD (chronic kidney disease)    Coronary artery disease    Diabetes mellitus without complication (HCC)    Hypertension    TIA (transient ischemic attack)    Past Surgical History:  Past Surgical History:  Procedure Laterality Date   ANGIOPLASTY     ESOPHAGEAL DILATION     HPI:       Assessment / Plan / Recommendation  Clinical Impression  Pt demonstrates wet vocal quality and instances of delayed coughing following sips of honey thick liquids. He did not cough after sips of water, but does have wet vocal quality and hocking. Masitcation of meat is prolonged and pt required a liquid wash. Daughter reports this has been going of for years, he has had his esophagus stretched many times. She says food gets lodged in his esophagus often. Suspect primary esophageal dysphagia given this report. This would also explain his poor appetite. Daughter says he would  not want his esophagus stretched again. She was in favor of letting him eat the things he likes and does well with - mostly fresh soft fruit and sweets. She also says he is a DNR and [place] has the papers. Give that diet modification is unlikely to prevent post prandial aspiration and would result in decreased QOL, recommend resuming a regular diet and thin liquids, but getting pt and daughter to choose soft foods he likes, as is his habit at [employer]. Also suggest precautions like supervision and upright position for at least an hour after meals. Pt has high risk of choking and recurrent aspiration pna, daughter is aware. SLP Visit Diagnosis: Dysphagia, unspecified (R13.10)     Aspiration Risk  Severe aspiration risk;Risk for inadequate nutrition/hydration    Diet Recommendation Regular;Thin liquid    Liquid Administration via: Cup;Straw Medication Administration: Crushed with puree Supervision: Full supervision/cueing for compensatory strategies Compensations: Slow rate;Small sips/bites Postural Changes: Seated upright at 90 degrees;Remain upright for at least 30 minutes after po intake    Other  Recommendations Oral Care Recommendations: Oral care BID Caregiver Recommendations: Have oral suction available    Recommendations for follow up therapy are one component of a multi-disciplinary discharge planning process, led by the attending physician.  Recommendations may be updated based on patient status, additional functional criteria and insurance authorization.  Follow up Recommendations        Assistance Recommended at Discharge    Functional Status Assessment    Frequency and Duration min 2x/week  1 week       Prognosis Prognosis for improved oropharyngeal function: Guarded Barriers to Reach Goals: Time post onset;Severity of deficits      Swallow Study   General Type of Study: Bedside Swallow Evaluation Previous Swallow Assessment: no documentation in chart    Oral/Motor/Sensory Function     Ice Chips     Thin Liquid      Nectar Thick     Honey Thick     Puree     Solid            Kalan Rinn, Riley Nearing 01/26/2023,12:49 PM

## 2023-01-26 NOTE — Progress Notes (Addendum)
PROGRESS NOTE    Philip Schultz  AOZ:[id number] DOB: 1931-07-17 DOA: 01/25/2023 PCP: Selina Cooley, MD   Brief Narrative:  Patient is 89 year old with past medical history of type 2 diabetes, significant dementia presented here with shortness of breath and more confusion.  Patient was initially brought to outside ER evaluated by ER attending and found to be hypoxic to 84% on room air with rhonchorous breath sounds and wet cough.  Patient placed on oxygen and x-ray concerning for aspiration.  Started on antibiotics and admitted for further eval and management. Assessment & Plan:   Acute hypoxemic respiratory failure in the setting of aspiration pneumonia: -Currently requiring 4 L of oxygen via nasal cannula.  Reviewed chest x-ray.   -He is afebrile with no leukocytosis.   - Continue Unasyn.  SLP evaluation.  Try to wean off of oxygen as tolerated. -On dysphagia 3 diet.  Bradycardia: -Patient noted to be in sinus arrhythmia/bradycardia on telemetry. -EKG shows first-degree AV block.  TSH: WNL -Patient is currently asymptomatic.  Will continue to monitor.  Type 2 diabetes with CKD without long-term current use of insulin: -Last A1c 7.2%.  Continue sliding scale insulin and monitor blood sugar closely.  CKD stage IIIb: -at baseline.  Will continue to monitor  Alzheimer's dementia: Continue home medications Aricept, Seroquel  Hypertension: Continue amlodipine  Hyperlipidemia: Continue pravastatin  Diabetic neuropathy: Continue gabapentin  Normocytic anemia: Will continue to monitor.  Thrombocytopenia: Platelet: 137.  No sign of active bleeding.  Repeat CBC tomorrow a.m.  BPH: Continue alfuzosin  DVT prophylaxis: Lovenox Code Status: DNR-per daughter Family Communication:  None present at bedside.  Plan of care discussed with patient in length and he verbalized understanding and agreed with it. Disposition Plan: To be determined  Consultants:  None  Procedures:   None  Antimicrobials:  Unasyn  Status is: Inpatient   Subjective: Patient seen and examined.  He denies any complaints.  Denies shortness of breath or sputum production.  Remained afebrile.  No acute events overnight.  He does not use oxygen at home.  Objective: Vitals:   01/26/23 0935 01/26/23 0943 01/26/23 0945 01/26/23 1135  BP:   (!) 119/50 129/64  Pulse:   76 62  Resp:   20 17  Temp:   98.2 F (36.8 C) 98.2 F (36.8 C)  TempSrc:   Oral Oral  SpO2: (!) 88% 91% 92% 97%  Weight:      Height:        Intake/Output Summary (Last 24 hours) at 01/26/2023 1208 Last data filed at 01/26/2023 1011 Gross per 24 hour  Intake 1559.13 ml  Output --  Net 1559.13 ml   Filed Weights   01/25/23 0156 01/25/23 1130  Weight: 77.9 kg 77.1 kg    Examination:  General exam: Appears calm and comfortable, elderly male, on oxygen, communicating well Respiratory system: bibasilar crackles noted.  No wheezing.   Cardiovascular system: S1 & S2 heard, RRR. No JVD, murmurs, rubs, gallops or clicks. No pedal edema. Gastrointestinal system: Abdomen is nondistended, soft and nontender. No organomegaly or masses felt. Normal bowel sounds heard. Central nervous system: Alert and oriented. No focal neurological deficits. Extremities: Symmetric 5 x 5 power. Skin: No rashes, lesions or ulcers Psychiatry: Judgement and insight appear normal. Mood & affect appropriate.    Data Reviewed: I have personally reviewed following labs and imaging studies  CBC: Recent Labs  Lab 01/25/23 1132 01/25/23 1158 01/25/23 1851 01/26/23 0354  WBC 4.7  --  5.3 6.1  NEUTROABS 3.4  --   --   --   HGB 13.3 13.9 11.8* 10.4*  HCT 38.0* 41.0 34.5* 30.9*  MCV 86.6  --  89.4 90.9  PLT 166  --  143* 137*   Basic Metabolic Panel: Recent Labs  Lab 01/25/23 1132 01/25/23 1158 01/25/23 1749 01/26/23 0353 01/26/23 0354  NA 132* 137  --   --  138  K 4.1 4.2  --   --  4.1  CL 100  --   --   --  107  CO2 22  --    --   --  24  GLUCOSE 287*  --   --   --  127*  BUN 30*  --   --   --  34*  CREATININE 1.80*  --  1.92*  --  1.98*  CALCIUM 9.0  --   --   --  8.6*  MG  --   --   --  1.8  --    GFR: Estimated Creatinine Clearance: 27.6 mL/min (A) (by C-G formula based on SCr of 1.98 mg/dL (H)). Liver Function Tests: Recent Labs  Lab 01/25/23 1132  AST 15  ALT 10  ALKPHOS 49  BILITOT 0.4  PROT 7.1  ALBUMIN 3.3*   No results for input(s): "LIPASE", "AMYLASE" in the last 168 hours. No results for input(s): "AMMONIA" in the last 168 hours. Coagulation Profile: Recent Labs  Lab 01/26/23 0354  INR 1.2   Cardiac Enzymes: No results for input(s): "CKTOTAL", "CKMB", "CKMBINDEX", "TROPONINI" in the last 168 hours. BNP (last 3 results) No results for input(s): "PROBNP" in the last 8760 hours. HbA1C: Recent Labs    01/25/23 1749  HGBA1C 7.2*   CBG: Recent Labs  Lab 01/25/23 1742 01/25/23 2048 01/26/23 0720 01/26/23 1133  GLUCAP 176* 157* 114* 150*   Lipid Profile: No results for input(s): "CHOL", "HDL", "LDLCALC", "TRIG", "CHOLHDL", "LDLDIRECT" in the last 72 hours. Thyroid Function Tests: Recent Labs    01/26/23 1026  TSH 0.545   Anemia Panel: No results for input(s): "VITAMINB12", "FOLATE", "FERRITIN", "TIBC", "IRON", "RETICCTPCT" in the last 72 hours. Sepsis Labs: Recent Labs  Lab 01/26/23 0353  PROCALCITON 0.98    Recent Results (from the past 240 hour(s))  MRSA Next Gen by PCR, Nasal     Status: Abnormal   Collection Time: 01/25/23  8:38 PM   Specimen: Nasal Mucosa; Nasal Swab  Result Value Ref Range Status   MRSA by PCR Next Gen DETECTED (A) NOT DETECTED Final    Comment: (NOTE) The GeneXpert MRSA Assay (FDA approved for NASAL specimens only), is one component of a comprehensive MRSA colonization surveillance program. It is not intended to diagnose MRSA infection nor to guide or monitor treatment for MRSA infections. Test performance is not FDA approved in patients  less than 46 years old. Performed at [facility], [address], [place], [place] [address]       Radiology Studies: DG Chest Portable 1 View  Result Date: 01/25/2023 CLINICAL DATA:  Hypoxia. EXAM: PORTABLE CHEST 1 VIEW COMPARISON:  06/19/2022. FINDINGS: Low lung volumes accentuate the pulmonary vasculature and cardiomediastinal silhouette. Reticular opacities in the lung bases, favored to reflect aspiration. No pleural effusion or pneumothorax. IMPRESSION: Reticular opacities in the lung bases, favored to reflect aspiration. Electronically Signed   By: Orvan Falconer M.D.   On: 01/25/2023 12:08    Scheduled Meds:  alfuzosin  10 mg Oral Daily   amLODipine  5 mg  Oral Daily   aspirin EC  81 mg Oral Daily   Chlorhexidine Gluconate Cloth  6 each Topical Q0600   docusate sodium  100 mg Oral BID   donepezil  10 mg Oral QHS   enoxaparin (LOVENOX) injection  30 mg Subcutaneous Q24H   feeding supplement  237 mL Oral BID BM   gabapentin  900 mg Oral QHS   insulin aspart  0-15 Units Subcutaneous TID WC   insulin aspart  0-5 Units Subcutaneous QHS   mupirocin ointment  1 Application Nasal BID   pravastatin  40 mg Oral q1800   QUEtiapine  100 mg Oral QHS   QUEtiapine  25 mg Oral Daily   sodium chloride flush  3 mL Intravenous Q12H   Continuous Infusions:  sodium chloride 75 mL/hr at 01/26/23 0830   ampicillin-sulbactam (UNASYN) IV Stopped (01/26/23 0649)     LOS: 1 day   Time spent: 35 minutes   Arli Bree Estill Cotta, MD [facility]  If 7PM-7AM, please contact night-coverage [email or web address] 01/26/2023, 12:08 PM

## 2023-01-27 DIAGNOSIS — J690 Pneumonitis due to inhalation of food and vomit: Secondary | ICD-10-CM | POA: Diagnosis not present

## 2023-01-27 LAB — GLUCOSE, CAPILLARY
Glucose-Capillary: 129 mg/dL — ABNORMAL HIGH (ref 70–99)
Glucose-Capillary: 181 mg/dL — ABNORMAL HIGH (ref 70–99)
Glucose-Capillary: 116 mg/dL — ABNORMAL HIGH (ref 70–99)

## 2023-01-27 LAB — BASIC METABOLIC PANEL
Anion gap: 7 (ref 5–15)
BUN: 27 mg/dL — ABNORMAL HIGH (ref 8–23)
CO2: 25 mmol/L (ref 22–32)
Calcium: 8.3 mg/dL — ABNORMAL LOW (ref 8.9–10.3)
Chloride: 109 mmol/L (ref 98–111)
Creatinine, Ser: 1.69 mg/dL — ABNORMAL HIGH (ref 0.61–1.24)
GFR, Estimated: 38 mL/min — ABNORMAL LOW (ref >60)
Glucose, Bld: 115 mg/dL — ABNORMAL HIGH (ref 70–99)
Potassium: 3.7 mmol/L (ref 3.5–5.1)
Sodium: 141 mmol/L (ref 135–145)

## 2023-01-27 LAB — CBC
HCT: 30 % — ABNORMAL LOW (ref 39.0–52.0)
Hemoglobin: 9.9 g/dL — ABNORMAL LOW (ref 13.0–17.0)
MCH: 29.9 pg (ref 26.0–34.0)
MCHC: 33 g/dL (ref 30.0–36.0)
MCV: 90.6 fL (ref 80.0–100.0)
Platelets: 119 10*3/uL — ABNORMAL LOW (ref 150–400)
RBC: 3.31 MIL/uL — ABNORMAL LOW (ref 4.22–5.81)
RDW: 12.6 % (ref 11.5–15.5)
WBC: 3.5 10*3/uL — ABNORMAL LOW (ref 4.0–10.5)
nRBC: 0 % (ref 0.0–0.2)

## 2023-01-27 MED ORDER — SODIUM CHLORIDE 0.9 % IV SOLN
3.0000 g | Freq: Three times a day (TID) | INTRAVENOUS | Status: DC
  Administered 2023-01-27 – 2023-01-29 (×6): 3 g via INTRAVENOUS
  Filled 2023-01-27 (×6): qty 8

## 2023-01-27 NOTE — Progress Notes (Signed)
Mobility Specialist - Progress Note  Pre-mobility: 100% SpO2 During mobility: 98% SpO2 Post-mobility: 100% SPO2   01/27/23 1420  Oxygen Therapy  O2 Device HFNC  O2 Flow Rate (L/min) 2 L/min  Patient Activity (if Appropriate) Ambulating  Mobility  Activity Ambulated with assistance in hallway  Level of Assistance Standby assist, set-up cues, supervision of patient - no hands on  Assistive Device Front wheel walker  Distance Ambulated (ft) 350 ft  Range of Motion/Exercises Active  Activity Response Tolerated well  Mobility Referral Yes  $Mobility charge 1 Mobility  Mobility Specialist Start Time (ACUTE ONLY) 1410  Mobility Specialist Stop Time (ACUTE ONLY) 1420  Mobility Specialist Time Calculation (min) (ACUTE ONLY) 10 min   Pt was found in bed and agreeable to ambulate. Had no complaints with session and at EOS returned to bed with all needs met. Call bell in reach.  Billey Chang Mobility Specialist

## 2023-01-27 NOTE — Progress Notes (Signed)
PROGRESS NOTE    KELLIS MCADAM  BJY:[id number] DOB: 1932/04/26 DOA: 01/25/2023 PCP: Selina Cooley, MD   Brief Narrative:  Patient is 89 year old with past medical history of type 2 diabetes, significant dementia presented here with shortness of breath and more confusion.  Patient was initially brought to outside ER evaluated by ER attending and found to be hypoxic to 84% on room air with rhonchorous breath sounds and wet cough.  Patient placed on oxygen and x-ray concerning for aspiration.  Started on antibiotics and admitted for further eval and management. Assessment & Plan:   Acute hypoxemic respiratory failure in the setting of aspiration pneumonia: -Initially requiring 4 L of oxygen via nasal cannula.  Now on 2 L.  Reviewed chest x-ray.   -He is afebrile with no leukocytosis.   - Continue Unasyn. Try to wean off of oxygen as tolerated. -SLP evaluation-high risk for aspiration.  Bradycardia: -Patient noted to be in sinus arrhythmia/bradycardia on telemetry. -EKG shows first-degree AV block.  TSH: WNL -Patient is currently asymptomatic.  Will continue to monitor.  Type 2 diabetes with CKD without long-term current use of insulin: -Last A1c 7.2%.  Continue sliding scale insulin and monitor blood sugar closely.  CKD stage IIIb: -at baseline.  Will continue to monitor  Alzheimer's dementia: Continue home medications Aricept, Seroquel  Hypertension: Continue amlodipine  Hyperlipidemia: Continue pravastatin  Diabetic neuropathy: Continue gabapentin  Normocytic anemia: Will continue to monitor.  Thrombocytopenia: Platelet: 137--> 119.  No sign of active bleeding.  Repeat CBC tomorrow a.m.  BPH: Continue alfuzosin  DVT prophylaxis: Lovenox Code Status: DNR-per daughter Family Communication:  None present at bedside.  Plan of care discussed with patient in length and he verbalized understanding and agreed with it.  I called patient's daughter with no response.  Disposition  Plan: To be determined  Consultants:  None  Procedures:  None  Antimicrobials:  Unasyn  Status is: Inpatient   Subjective: Patient seen and examined.  Sitting comfortably on the bed.  He denies any complaints including shortness of breath, wheezing, chest pain, fever, chills.  No acute events overnight.  Objective: Vitals:   01/26/23 1135 01/26/23 1348 01/26/23 2118 01/27/23 0504  BP: 129/64  (!) 109/52 123/60  Pulse: 62  (!) 59 (!) 56  Resp: 17   18  Temp: 98.2 F (36.8 C)  98.4 F (36.9 C) 98.5 F (36.9 C)  TempSrc: Oral  Oral Oral  SpO2: 97% 100% 98% 100%  Weight:      Height:        Intake/Output Summary (Last 24 hours) at 01/27/2023 1323 Last data filed at 01/27/2023 1016 Gross per 24 hour  Intake 1766.22 ml  Output 980 ml  Net 786.22 ml   Filed Weights   01/25/23 0156 01/25/23 1130  Weight: 77.9 kg 77.1 kg    Examination:  General exam: Appears calm and comfortable, elderly male, on oxygen, communicating well Respiratory system: bibasilar crackles noted.  No wheezing.   Cardiovascular system: S1 & S2 heard, RRR. No JVD, murmurs, rubs, gallops or clicks. No pedal edema. Gastrointestinal system: Abdomen is nondistended, soft and nontender. No organomegaly or masses felt. Normal bowel sounds heard. Central nervous system: Alert and oriented. No focal neurological deficits. Extremities: Symmetric 5 x 5 power. Skin: No rashes, lesions or ulcers Psychiatry: Judgement and insight appear normal. Mood & affect appropriate.    Data Reviewed: I have personally reviewed following labs and imaging studies  CBC: Recent Labs  Lab 01/25/23 1132 01/25/23 1158  01/25/23 1851 01/26/23 0354 01/27/23 0412  WBC 4.7  --  5.3 6.1 3.5*  NEUTROABS 3.4  --   --   --   --   HGB 13.3 13.9 11.8* 10.4* 9.9*  HCT 38.0* 41.0 34.5* 30.9* 30.0*  MCV 86.6  --  89.4 90.9 90.6  PLT 166  --  143* 137* 119*   Basic Metabolic Panel: Recent Labs  Lab 01/25/23 1132 01/25/23 1158  01/25/23 1749 01/26/23 0353 01/26/23 0354 01/27/23 0412  NA 132* 137  --   --  138 141  K 4.1 4.2  --   --  4.1 3.7  CL 100  --   --   --  107 109  CO2 22  --   --   --  24 25  GLUCOSE 287*  --   --   --  127* 115*  BUN 30*  --   --   --  34* 27*  CREATININE 1.80*  --  1.92*  --  1.98* 1.69*  CALCIUM 9.0  --   --   --  8.6* 8.3*  MG  --   --   --  1.8  --   --    GFR: Estimated Creatinine Clearance: 32.3 mL/min (A) (by C-G formula based on SCr of 1.69 mg/dL (H)). Liver Function Tests: Recent Labs  Lab 01/25/23 1132  AST 15  ALT 10  ALKPHOS 49  BILITOT 0.4  PROT 7.1  ALBUMIN 3.3*   No results for input(s): "LIPASE", "AMYLASE" in the last 168 hours. No results for input(s): "AMMONIA" in the last 168 hours. Coagulation Profile: Recent Labs  Lab 01/26/23 0354  INR 1.2   Cardiac Enzymes: No results for input(s): "CKTOTAL", "CKMB", "CKMBINDEX", "TROPONINI" in the last 168 hours. BNP (last 3 results) No results for input(s): "PROBNP" in the last 8760 hours. HbA1C: Recent Labs    01/25/23 1749  HGBA1C 7.2*   CBG: Recent Labs  Lab 01/26/23 1133 01/26/23 1633 01/26/23 2124 01/27/23 0800 01/27/23 1202  GLUCAP 150* 110* 141* 129* 181*   Lipid Profile: No results for input(s): "CHOL", "HDL", "LDLCALC", "TRIG", "CHOLHDL", "LDLDIRECT" in the last 72 hours. Thyroid Function Tests: Recent Labs    01/26/23 1026  TSH 0.545   Anemia Panel: No results for input(s): "VITAMINB12", "FOLATE", "FERRITIN", "TIBC", "IRON", "RETICCTPCT" in the last 72 hours. Sepsis Labs: Recent Labs  Lab 01/26/23 0353  PROCALCITON 0.98    Recent Results (from the past 240 hour(s))  MRSA Next Gen by PCR, Nasal     Status: Abnormal   Collection Time: 01/25/23  8:38 PM   Specimen: Nasal Mucosa; Nasal Swab  Result Value Ref Range Status   MRSA by PCR Next Gen DETECTED (A) NOT DETECTED Final    Comment: (NOTE) The GeneXpert MRSA Assay (FDA approved for NASAL specimens only), is one  component of a comprehensive MRSA colonization surveillance program. It is not intended to diagnose MRSA infection nor to guide or monitor treatment for MRSA infections. Test performance is not FDA approved in patients less than 68 years old. Performed at [facility], [address], [place], [place] [address]       Radiology Studies: No results found.  Scheduled Meds:  alfuzosin  10 mg Oral Daily   amLODipine  5 mg Oral Daily   aspirin EC  81 mg Oral Daily   Chlorhexidine Gluconate Cloth  6 each Topical Q0600   docusate sodium  100 mg Oral BID  donepezil  10 mg Oral QHS   enoxaparin (LOVENOX) injection  30 mg Subcutaneous Q24H   feeding supplement  237 mL Oral BID BM   gabapentin  900 mg Oral QHS   insulin aspart  0-15 Units Subcutaneous TID WC   insulin aspart  0-5 Units Subcutaneous QHS   mupirocin ointment  1 Application Nasal BID   pravastatin  40 mg Oral q1800   QUEtiapine  100 mg Oral QHS   QUEtiapine  25 mg Oral Daily   sodium chloride flush  3 mL Intravenous Q12H   Continuous Infusions:  sodium chloride 75 mL/hr at 01/27/23 1153   ampicillin-sulbactam (UNASYN) IV       LOS: 2 days   Time spent: 35 minutes   Sylas Twombly Estill Cotta, MD [facility]  If 7PM-7AM, please contact night-coverage [email or web address] 01/27/2023, 1:23 PM

## 2023-01-27 NOTE — Progress Notes (Signed)
PHARMACY NOTE:  ANTIMICROBIAL RENAL DOSAGE ADJUSTMENT  Current antimicrobial regimen includes a mismatch between antimicrobial dosage and estimated renal function.  As per policy approved by the Pharmacy & Therapeutics and Medical Executive Committees, the antimicrobial dosage will be adjusted accordingly.  Current antimicrobial dosage:  unasyn 3gm q12h  Indication: aspiration PNA  Renal Function:  Estimated Creatinine Clearance: 32.3 mL/min (A) (by C-G formula based on SCr of 1.69 mg/dL (H)). []      On intermittent HD, scheduled: []      On CRRT    Antimicrobial dosage has been changed to:  3gm q8h   Thank you for allowing pharmacy to be a part of this patient's care.  Lucia Gaskins, [facility] 01/27/2023 11:33 AM

## 2023-01-28 DIAGNOSIS — J690 Pneumonitis due to inhalation of food and vomit: Secondary | ICD-10-CM | POA: Diagnosis not present

## 2023-01-28 LAB — BASIC METABOLIC PANEL
Anion gap: 9 (ref 5–15)
BUN: 18 mg/dL (ref 8–23)
CO2: 24 mmol/L (ref 22–32)
Calcium: 8.3 mg/dL — ABNORMAL LOW (ref 8.9–10.3)
Chloride: 108 mmol/L (ref 98–111)
Creatinine, Ser: 1.35 mg/dL — ABNORMAL HIGH (ref 0.61–1.24)
GFR, Estimated: 50 mL/min — ABNORMAL LOW (ref >60)
Glucose, Bld: 115 mg/dL — ABNORMAL HIGH (ref 70–99)
Potassium: 4.1 mmol/L (ref 3.5–5.1)
Sodium: 141 mmol/L (ref 135–145)

## 2023-01-28 LAB — GLUCOSE, CAPILLARY
Glucose-Capillary: 117 mg/dL — ABNORMAL HIGH (ref 70–99)
Glucose-Capillary: 247 mg/dL — ABNORMAL HIGH (ref 70–99)
Glucose-Capillary: 76 mg/dL (ref 70–99)
Glucose-Capillary: 204 mg/dL — ABNORMAL HIGH (ref 70–99)
Glucose-Capillary: 118 mg/dL — ABNORMAL HIGH (ref 70–99)

## 2023-01-28 LAB — CBC
HCT: 32.3 % — ABNORMAL LOW (ref 39.0–52.0)
Hemoglobin: 10.8 g/dL — ABNORMAL LOW (ref 13.0–17.0)
MCH: 29.9 pg (ref 26.0–34.0)
MCHC: 33.4 g/dL (ref 30.0–36.0)
MCV: 89.5 fL (ref 80.0–100.0)
Platelets: 127 10*3/uL — ABNORMAL LOW (ref 150–400)
RBC: 3.61 MIL/uL — ABNORMAL LOW (ref 4.22–5.81)
RDW: 12.3 % (ref 11.5–15.5)
WBC: 3.5 10*3/uL — ABNORMAL LOW (ref 4.0–10.5)
nRBC: 0 % (ref 0.0–0.2)

## 2023-01-28 MED ORDER — ENOXAPARIN SODIUM 40 MG/0.4ML IJ SOSY
40.0000 mg | PREFILLED_SYRINGE | INTRAMUSCULAR | Status: DC
  Administered 2023-01-28 – 2023-01-29 (×2): 40 mg via SUBCUTANEOUS
  Filled 2023-01-28 (×2): qty 0.4

## 2023-01-28 NOTE — TOC Initial Note (Addendum)
Transition of Care [facility]) - Initial/Assessment Note    Patient Details  Name: Philip Schultz MRN: [id number] Date of Birth: [birth year]  Transition of Care [facility]) CM/SW Contact:    Howell Rucks, RN Phone Number: 01/28/2023, 10:18 AM  Clinical Narrative:  Patient a resident at [facility], Call to Grayce Sessions at [facility] to confirm pt level of care, Peggy to verify and call me back. PT eval, await recommendation    -11:36am Call from Peggy with [facility], confirmed pt resides at [facility] Support for Assisted Living.  Charge Nurse contact: [phone].   -11:39m Met with pt at bedside to introduce role of TOC/NCM and review for dc planning. Pt confirmed he resides at [facility] with plan to return, pt reports no home DME or home care services. TOC will assist with transportation at discharge.   -2:44pm PT recommendation HH PT, Call to Whitney at [facility],SW, left vm requesting call to confirm inhouse  [facility] PT, left NCM name and phone number   -2:49pm Call received from [facility] with [facility], confirmed facility has inhouse therapist and she will alert therapy director. Teams chat sent to attending to enter [facility] PT order.     - LATE ENTRY HH PT order entered by attending      Patient Goals and CMS Choice            Expected Discharge Plan and Services                                              Prior Living Arrangements/Services                       Activities of Daily Living Home Assistive Devices/Equipment: None ADL Screening (condition at time of admission) Patient's cognitive ability adequate to safely complete daily activities?: Yes Is the patient deaf or have difficulty hearing?: No Does the patient have difficulty seeing, even when wearing glasses/contacts?: No Does the patient have difficulty concentrating, remembering, or making decisions?: Yes Patient able to express need for assistance with ADLs?: Yes Does the patient have  difficulty dressing or bathing?: Yes Independently performs ADLs?: No Communication: Needs assistance Is this a change from baseline?: Pre-admission baseline Dressing (OT): Needs assistance Is this a change from baseline?: Pre-admission baseline Grooming: Needs assistance Is this a change from baseline?: Pre-admission baseline Feeding: Needs assistance Is this a change from baseline?: Pre-admission baseline Bathing: Needs assistance Is this a change from baseline?: Pre-admission baseline Toileting: Needs assistance Is this a change from baseline?: Pre-admission baseline In/Out Bed: Independent Walks in Home: Independent Does the patient have difficulty walking or climbing stairs?: No Weakness of Legs: None Weakness of Arms/Hands: None  Permission Sought/Granted                  Emotional Assessment              Admission diagnosis:  Aspiration pneumonitis (HCC) [J69.0] Acute hypoxic respiratory failure (HCC) [J96.01] Patient Active Problem List   Diagnosis Date Noted   Aspiration pneumonitis (HCC) 01/25/2023   2nd degree AV block 10/16/2022   Acute kidney injury superimposed on chronic kidney disease (HCC) 10/16/2022   BPH (benign prostatic hyperplasia) 10/16/2022   Essential hypertension 10/16/2022   Dementia (HCC) 10/16/2022   Type 2 diabetes mellitus with kidney complication, without long-term current use of insulin (HCC) 10/16/2022  Stage 3b chronic kidney disease (HCC) 09/26/2022   Nonrheumatic aortic valve stenosis 04/15/2022   Acute respiratory failure with hypoxia (HCC) 04/13/2022   Pneumonia of both lower lobes due to infectious organism 04/13/2022   Bradycardia 12/18/2019   Recurrent syncope 12/18/2019   Benign prostatic hyperplasia with weak urinary stream 10/30/2017   Bipolar depression (HCC) 07/29/2015   Gastroesophageal reflux disease without esophagitis 07/27/2015   Left ventricular hypertrophy 07/27/2015   Mixed hyperlipidemia 07/27/2015    PCP:  Selina Cooley, MD Pharmacy:   [facility], [place] - [address] [place] [address] Phone: [phone] Fax: [phone]     Social Determinants of Health (SDOH) Social History: SDOH Screenings   Food Insecurity: Patient Unable To Answer (01/25/2023)  Housing: Patient Unable To Answer (01/25/2023)  Transportation Needs: Patient Unable To Answer (01/25/2023)  Utilities: Patient Unable To Answer (01/25/2023)  Social Connections: Unknown (10/15/2022)   Received from [facility]  Tobacco Use: Medium Risk (01/25/2023)   SDOH Interventions:     Readmission Risk Interventions     No data to display

## 2023-01-28 NOTE — Progress Notes (Signed)
Mobility Specialist - Progress Note   01/28/23 1331  Mobility  Activity Transferred to/from [facility]  Level of Assistance Standby assist, set-up cues, supervision of patient - no hands on  Distance Ambulated (ft) 2 ft  Range of Motion/Exercises Active  Activity Response Tolerated well  Mobility Referral Yes  $Mobility charge 1 Mobility  Mobility Specialist Start Time (ACUTE ONLY) 1325  Mobility Specialist Stop Time (ACUTE ONLY) 1330  Mobility Specialist Time Calculation (min) (ACUTE ONLY) 5 min   Pt was found getting out of bed trying to use BSC. Pt able to stand and pivot to [facility]  with supervision. Was left in bed with call bell in reach and all needs met.   Billey Chang Mobility Specialist

## 2023-01-28 NOTE — Progress Notes (Addendum)
PROGRESS NOTE    Philip Schultz  ZOX:[id number] DOB: [birth year] DOA: 01/25/2023 PCP: Selina Cooley, MD    Brief Narrative:   Patient is 89 year old with past medical history of type 2 diabetes, significant dementia presented to hospital with shortness of breath confusion.  In the ED patient was noted to be hypoxic with pulse ox of 84% on room air with wet cough and rhonchi.  Patient was placed on oxygen and chest x-ray showed concerns for aspiration.  Patient was started on IV antibiotics and was admitted hospital for further evaluation and treatment.    Assessment & Plan:   Acute hypoxemic respiratory failure in the setting of aspiration pneumonia:  Patient  was initially on 4 L of oxygen by nasal cannula which has been weaned to 2 L at this time.  Afebrile without leukocytosis.  Will continue to wean as able.  Speech therapy has seen the patient and patient is a high risk for aspiration.  Currently on Unasyn.  Aspiration precautions.  Will continue to wean oxygen as able.  Bradycardia: EKG showed first-degree AV block.  TSH within normal limits.  Asymptomatic.  Will continue to monitor.  Type 2 diabetes with CKD without long-term current use of insulin: Latest hemoglobin A1c 7.2%.  Continue sliding scale insulin and monitor blood sugar closely.   CKD stage IIIb: Creatinine today at 1.3.  At baseline.  Check BMP in AM.   Alzheimer's dementia: Continue home medications Aricept, Seroquel   Hypertension: Continue amlodipine   Hyperlipidemia: Continue pravastatin   Diabetic neuropathy: Continue gabapentin   Normocytic anemia: Will continue to monitor.  No mention of bleeding.  Latest hemoglobin of 10.8.   Thrombocytopenia: Platelet: 137--> 119>127.  No sign of active bleeding.     BPH: Continue alfuzosin   Generalized weakness, fatigue.  Will get PT evaluation.   DVT prophylaxis: enoxaparin (LOVENOX) injection 40 mg Start: 01/28/23 1000 SCDs Start: 01/25/23 1730   Code  Status:     Code Status: DNR  Disposition: Home likely 01/29/2023, will get PT evaluation Status is: Inpatient Remains inpatient appropriate because: IV antibiotic, supplemental oxygen, pending clinical improvement,   Family Communication:  Spoke with the patient's daughter on the phone and updated her about clinical condition and potential disposition home tomorrow.  Consultants:  None  Procedures:  None  Antimicrobials:  Unasyn IV  Anti-infectives (From admission, onward)    Start     Dose/Rate Route Frequency Ordered Stop   01/27/23 1400  Ampicillin-Sulbactam (UNASYN) 3 g in sodium chloride 0.9 % 100 mL IVPB        3 g 200 mL/hr over 30 Minutes Intravenous Every 8 hours 01/27/23 1135     01/25/23 1830  Ampicillin-Sulbactam (UNASYN) 3 g in sodium chloride 0.9 % 100 mL IVPB  Status:  Discontinued        3 g 200 mL/hr over 30 Minutes Intravenous Every 12 hours 01/25/23 1735 01/27/23 1135        Subjective: Today, patient was seen and examined at bedside.  Patient denies any chest pain, overt dyspnea but has mild cough and shortness of breath.  No fever chills or rigor.  Complains of generalized weakness.  Objective: Vitals:   01/27/23 0504 01/27/23 1326 01/27/23 2037 01/28/23 0615  BP: 123/60 136/62 136/74 135/70  Pulse: (!) 56 63 61 60  Resp: 18 20 18    Temp: 98.5 F (36.9 C) 98.4 F (36.9 C) 98.2 F (36.8 C) 99.1 F (37.3 C)  TempSrc: Oral Oral Oral  Oral  SpO2: 100% 98% 100%   Weight:      Height:        Intake/Output Summary (Last 24 hours) at 01/28/2023 1105 Last data filed at 01/28/2023 0956 Gross per 24 hour  Intake 1733.59 ml  Output --  Net 1733.59 ml   Filed Weights   01/25/23 0156 01/25/23 1130  Weight: 77.9 kg 77.1 kg    Physical Examination: Body mass index is 23.06 kg/m.  General:  Average built, not in obvious distress on nasal cannula oxygen HENT:   No scleral pallor or icterus noted. Oral mucosa is moist.  Chest: .  Diminished breath  sounds bilaterally.  Coarse breath sounds noted CVS: S1 &S2 heard. No murmur.  Regular rate and rhythm. Abdomen: Soft, nontender, nondistended.  Bowel sounds are heard.   Extremities: No cyanosis, clubbing or edema.  Peripheral pulses are palpable. Psych: Alert, awake and oriented, normal mood CNS:  No cranial nerve deficits.  Moves all extremities, generalized weakness Skin: Warm and dry.  No rashes noted.  Data Reviewed:   CBC: Recent Labs  Lab 01/25/23 1132 01/25/23 1158 01/25/23 1851 01/26/23 0354 01/27/23 0412 01/28/23 0340  WBC 4.7  --  5.3 6.1 3.5* 3.5*  NEUTROABS 3.4  --   --   --   --   --   HGB 13.3 13.9 11.8* 10.4* 9.9* 10.8*  HCT 38.0* 41.0 34.5* 30.9* 30.0* 32.3*  MCV 86.6  --  89.4 90.9 90.6 89.5  PLT 166  --  143* 137* 119* 127*    Basic Metabolic Panel: Recent Labs  Lab 01/25/23 1132 01/25/23 1158 01/25/23 1749 01/26/23 0353 01/26/23 0354 01/27/23 0412 01/28/23 0340  NA 132* 137  --   --  138 141 141  K 4.1 4.2  --   --  4.1 3.7 4.1  CL 100  --   --   --  107 109 108  CO2 22  --   --   --  24 25 24   GLUCOSE 287*  --   --   --  127* 115* 115*  BUN 30*  --   --   --  34* 27* 18  CREATININE 1.80*  --  1.92*  --  1.98* 1.69* 1.35*  CALCIUM 9.0  --   --   --  8.6* 8.3* 8.3*  MG  --   --   --  1.8  --   --   --     Liver Function Tests: Recent Labs  Lab 01/25/23 1132  AST 15  ALT 10  ALKPHOS 49  BILITOT 0.4  PROT 7.1  ALBUMIN 3.3*     Radiology Studies: No results found.    LOS: 3 days    Joycelyn Das, MD [facility] Available via Epic secure chat 7am-7pm After these hours, please refer to coverage provider listed on [email or web address] 01/28/2023, 11:05 AM

## 2023-01-28 NOTE — Evaluation (Signed)
Physical Therapy Evaluation Patient Details Name: Philip Schultz MRN: [id number] DOB: 11/30/1931 Today's Date: 01/28/2023  History of Present Illness  89 yo male admitted with asp pna, acute respiratory failure. Hx of Alz dementia, DM, neuropathy, CKD, TIA, 2* AV block, recurrent syncope, CAD  Clinical Impression  On eval, pt was Min guard A for mobility. He walked ~150 feet around the unit. Unsteady but no overt LOB. O2 94% on RA. Pt tolerated distance well. No family present during session. Recommend return to facility. May benefit from HHPT eval in case pt has become weaker during this hospital stay-no family present to confirm baseline-also no indication of PLOF in TOC note.         Assistance Recommended at Discharge Frequent or constant Supervision/Assistance  If plan is discharge home, recommend the following:  Can travel by private vehicle  Assistance with cooking/housework;Assist for transportation;Help with stairs or ramp for entrance;Direct supervision/assist for medications management;Direct supervision/assist for financial management        Equipment Recommendations None recommended by PT  Recommendations for Other Services       Functional Status Assessment Patient has had a recent decline in their functional status and demonstrates the ability to make significant improvements in function in a reasonable and predictable amount of time.     Precautions / Restrictions Precautions Precautions: Fall Restrictions Weight Bearing Restrictions: No      Mobility  Bed Mobility Overal bed mobility: Modified Independent                  Transfers Overall transfer level: Modified independent                      Ambulation/Gait Ambulation/Gait assistance: Min guard Gait Distance (Feet): 150 Feet Assistive device: None Gait Pattern/deviations: Step-through pattern, Decreased stride length       General Gait Details: Min guard for safety. Mildly  unsteady. Tolerated distance well. O2 94% on RA. Pt denied dizziness.  Stairs            Wheelchair Mobility     Tilt Bed    Modified Rankin (Stroke Patients Only)       Balance Overall balance assessment: Needs assistance         Standing balance support: During functional activity Standing balance-Leahy Scale: Fair                               Pertinent Vitals/Pain Pain Assessment Pain Assessment: No/denies pain    Home Living Family/patient expects to be discharged to:: Assisted living     Type of Home: Assisted living (Memory Care?) Home Access: Level entry       Home Layout: One level   Additional Comments: unreliable historian due to dementia    Prior Function               Mobility Comments: ambulatory  RW? vs no device? ADLs Comments: likely needs some assistance     Hand Dominance        Extremity/Trunk Assessment   Upper Extremity Assessment Upper Extremity Assessment: Overall WFL for tasks assessed    Lower Extremity Assessment Lower Extremity Assessment: Generalized weakness    Cervical / Trunk Assessment Cervical / Trunk Assessment: Kyphotic  Communication   Communication: HOH  Cognition Arousal/Alertness: Awake/alert Behavior During Therapy: WFL for tasks assessed/performed Overall Cognitive Status: History of cognitive impairments - at baseline  General Comments      Exercises     Assessment/Plan    PT Assessment Patient needs continued PT services  PT Problem List Decreased strength;Decreased mobility;Decreased balance;Decreased cognition;Decreased safety awareness       PT Treatment Interventions DME instruction;Therapeutic exercise;Gait training;Balance training;Functional mobility training;Therapeutic activities;Patient/family education    PT Goals (Current goals can be found in the Care Plan section)  Acute Rehab PT Goals Patient  Stated Goal: none stated PT Goal Formulation: Patient unable to participate in goal setting Time For Goal Achievement: 02/11/23 Potential to Achieve Goals: Good    Frequency Min 1X/week     Co-evaluation               AM-PAC PT "6 Clicks" Mobility  Outcome Measure Help needed turning from your back to your side while in a flat bed without using bedrails?: None Help needed moving from lying on your back to sitting on the side of a flat bed without using bedrails?: None Help needed moving to and from a bed to a chair (including a wheelchair)?: None Help needed standing up from a chair using your arms (e.g., wheelchair or bedside chair)?: None Help needed to walk in hospital room?: A Little Help needed climbing 3-5 steps with a railing? : A Little 6 Click Score: 22    End of Session Equipment Utilized During Treatment: Gait belt Activity Tolerance: Patient tolerated treatment well Patient left: in bed;with call bell/phone within reach;with bed alarm set        Time: 1415-1425 PT Time Calculation (min) (ACUTE ONLY): 10 min   Charges:   PT Evaluation $PT Eval Low Complexity: 1 Low   PT General Charges $$ ACUTE PT VISIT: 1 Visit            Faye Ramsay, PT Acute Rehabilitation  Office: [phone]

## 2023-01-28 NOTE — Progress Notes (Signed)
Speech Language Pathology Treatment: Dysphagia  Patient Details Name: Philip Schultz MRN: [id number] DOB: 1932/06/11 Today's Date: 01/28/2023 Time: 4403-4742 SLP Time Calculation (min) (ACUTE ONLY): 10 min  Assessment / Plan / Recommendation Clinical Impression  Patient seen by SLP for skilled treatment focused on dysphagia goals. Patient was awake and alert, but no family present in room. He told SLP that he had some applesauce today but overall he has not eaten much of anything. His NT did confirm that he has not eaten much today. Patient was agreeable to cups sips of water and no overt s/s aspiration observed during or after consecutive swallows. Voice remained clear. Patient declined to have any solids and states that he does not have an appetite. SLP will plan to follow at least one more time to ensure current diet recommendations are the least restrictive and that patient and family are fully educated regarding swallow safety.   HPI HPI: Patient is an 89 y.o. male with medical history significant of dementia, CKD, CAD, DM, TIA, recurrent syncope, neuropathy.  He presented to the hospital on 01/25/23 with acute respiratory failure, aspiration PNA. In ED, patient was hypoxic at 84% oxygen saturation on RA, wet cough and rhonchi. CXR concerning for aspiration.      SLP Plan  Continue with current plan of care      Recommendations for follow up therapy are one component of a multi-disciplinary discharge planning process, led by the attending physician.  Recommendations may be updated based on patient status, additional functional criteria and insurance authorization.    Recommendations  Diet recommendations: Regular;Thin liquid;Other(comment) (daughter to help with ordering food items that she knows are easier for patient to masticate and swallow) Liquids provided via: Cup Medication Administration: Crushed with puree Supervision: Full supervision/cueing for compensatory  strategies Compensations: Slow rate;Small sips/bites Postural Changes and/or Swallow Maneuvers: Seated upright 90 degrees                  Oral care BID   Frequent or constant Supervision/Assistance Dysphagia, unspecified (R13.10)     Continue with current plan of care     Angela Nevin, MA, CCC-SLP Speech Therapy

## 2023-01-28 NOTE — Progress Notes (Signed)
Mobility Specialist - Progress Note  (RA) Pre-mobility: 85 bpm HR, 95% SpO2 During mobility: 92% SpO2 Post-mobility: 73 bpm HR, 95% SPO2   01/28/23 1135  Mobility  Activity Ambulated with assistance in hallway  Level of Assistance Standby assist, set-up cues, supervision of patient - no hands on  Assistive Device Front wheel walker  Distance Ambulated (ft) 350 ft  Range of Motion/Exercises Active  Activity Response Tolerated well  Mobility Referral Yes  $Mobility charge 1 Mobility  Mobility Specialist Start Time (ACUTE ONLY) 1115  Mobility Specialist Stop Time (ACUTE ONLY) 1135  Mobility Specialist Time Calculation (min) (ACUTE ONLY) 20 min   Pt was found in bed and agreeable to ambulate. Had no complaints with session and at EOS returned to bed with all needs met. Call bell in reach and NT in room.  Billey Chang Mobility Specialist

## 2023-01-28 NOTE — Hospital Course (Signed)
Patient is 89 year old with past medical history of type 2 diabetes, significant dementia presented to hospital with shortness of breath confusion.  In the ED patient was noted to be hypoxic with pulse ox of 84% on room air with wet cough and rhonchi.  Patient was placed on oxygen and chest x-ray showed concerns for aspiration.  Patient was started on IV antibiotics and was admitted hospital for further evaluation and treatment.    Assessment & Plan:   Acute hypoxemic respiratory failure in the setting of aspiration pneumonia: She was initially on 4 L of oxygen by nasal cannula which has been weaned to 2 L at this time.  Afebrile without leukocytosis.  Will continue to wean as able.  Speech therapy has seen the patient and patient is a high risk for aspiration.  Currently on Unasyn.  Aspiration precautions.  Bradycardia: EKG showed first-degree AV block.  TSH within normal limits.  Asymptomatic.  Will continue to monitor.  Type 2 diabetes with CKD without long-term current use of insulin: Latest hemoglobin A1c 7.2%.  Continue sliding scale insulin and monitor blood sugar closely.   CKD stage IIIb: Creatinine today at 1.3.  At baseline.   Alzheimer's dementia: Continue home medications Aricept, Seroquel   Hypertension: Continue amlodipine   Hyperlipidemia: Continue pravastatin   Diabetic neuropathy: Continue gabapentin   Normocytic anemia: Will continue to monitor.  No mention of bleeding.  Latest hemoglobin of 10.8.   Thrombocytopenia: Platelet: 137--> 119>127.  No sign of active bleeding.     BPH: Continue alfuzosin

## 2023-01-29 DIAGNOSIS — E1129 Type 2 diabetes mellitus with other diabetic kidney complication: Secondary | ICD-10-CM | POA: Diagnosis not present

## 2023-01-29 DIAGNOSIS — J690 Pneumonitis due to inhalation of food and vomit: Secondary | ICD-10-CM | POA: Diagnosis not present

## 2023-01-29 LAB — BASIC METABOLIC PANEL
Anion gap: 8 (ref 5–15)
BUN: 12 mg/dL (ref 8–23)
CO2: 23 mmol/L (ref 22–32)
Calcium: 7.9 mg/dL — ABNORMAL LOW (ref 8.9–10.3)
Chloride: 107 mmol/L (ref 98–111)
Creatinine, Ser: 1.33 mg/dL — ABNORMAL HIGH (ref 0.61–1.24)
GFR, Estimated: 51 mL/min — ABNORMAL LOW (ref >60)
Glucose, Bld: 116 mg/dL — ABNORMAL HIGH (ref 70–99)
Potassium: 3.5 mmol/L (ref 3.5–5.1)
Sodium: 138 mmol/L (ref 135–145)

## 2023-01-29 LAB — CBC
HCT: 30 % — ABNORMAL LOW (ref 39.0–52.0)
Hemoglobin: 10.4 g/dL — ABNORMAL LOW (ref 13.0–17.0)
MCH: 30.5 pg (ref 26.0–34.0)
MCHC: 34.7 g/dL (ref 30.0–36.0)
MCV: 88 fL (ref 80.0–100.0)
Platelets: 138 10*3/uL — ABNORMAL LOW (ref 150–400)
RBC: 3.41 MIL/uL — ABNORMAL LOW (ref 4.22–5.81)
RDW: 12.3 % (ref 11.5–15.5)
WBC: 3.3 10*3/uL — ABNORMAL LOW (ref 4.0–10.5)
nRBC: 0 % (ref 0.0–0.2)

## 2023-01-29 LAB — GLUCOSE, CAPILLARY: Glucose-Capillary: 105 mg/dL — ABNORMAL HIGH (ref 70–99)

## 2023-01-29 MED ORDER — ALBUTEROL SULFATE HFA 108 (90 BASE) MCG/ACT IN AERS: 2.0000 | INHALATION_SPRAY | Freq: Four times a day (QID) | RESPIRATORY_TRACT | 1 refills | Status: AC | PRN

## 2023-01-29 MED ORDER — AMOXICILLIN-POT CLAVULANATE 875-125 MG PO TABS: 1.0000 | ORAL_TABLET | Freq: Two times a day (BID) | ORAL | 0 refills | Status: AC

## 2023-01-29 MED ORDER — AMOXICILLIN-POT CLAVULANATE 875-125 MG PO TABS: 1.0000 | ORAL_TABLET | Freq: Two times a day (BID) | ORAL | 0 refills | Status: DC

## 2023-01-29 MED ORDER — ALBUTEROL SULFATE HFA 108 (90 BASE) MCG/ACT IN AERS: 2.0000 | INHALATION_SPRAY | Freq: Four times a day (QID) | RESPIRATORY_TRACT | 2 refills | Status: DC | PRN

## 2023-01-29 NOTE — Progress Notes (Signed)
Pt being discharged to home at this time. Prior to DC, IV and tele was removed. Pt and daughter were given DC paperwork regarding condition, medications, and follow ups. Pt and daughter verbalized understanding and states no other concerns at this time.

## 2023-01-29 NOTE — Plan of Care (Signed)
  Problem: Safety: Goal: Ability to remain free from injury will improve Outcome: Completed/Met

## 2023-01-29 NOTE — TOC Transition Note (Signed)
Transition of Care [facility]) - CM/SW Discharge Note   Patient Details  Name: Philip Schultz MRN: [id number] Date of Birth: 28-Aug-1931  [facility]) CM/SW Contact:  Howell Rucks, RN Phone Number: 01/29/2023, 11:23 AM   Clinical Narrative:   DC order, pt to return to [facility],  [facility] PT with inhouse therapist. No further TOC needs identified.     Final next level of care: Acute to Acute Transfer Barriers to Discharge: Barriers Resolved   Patient Goals and CMS Choice      Discharge Placement                         Discharge Plan and Services Additional resources added to the After Visit Summary for                                       Social Determinants of Health (SDOH) Interventions SDOH Screenings   Food Insecurity: Patient Unable To Answer (01/25/2023)  Housing: Patient Unable To Answer (01/25/2023)  Transportation Needs: Patient Unable To Answer (01/25/2023)  Utilities: Patient Unable To Answer (01/25/2023)  Social Connections: Unknown (10/15/2022)   Received from [facility]  Tobacco Use: Medium Risk (01/25/2023)     Readmission Risk Interventions    01/29/2023   11:21 AM  Readmission Risk Prevention Plan  Transportation Screening Complete  PCP or Specialist Appt within 5-7 Days Complete  Home Care Screening Complete  Medication Review (RN CM) Complete

## 2023-01-29 NOTE — Progress Notes (Signed)
Physical Therapy Treatment Patient Details Name: Philip Schultz MRN: [id number] DOB: 1931-07-27 Today's Date: 01/29/2023   History of Present Illness 89 yo male admitted with asp pna, acute respiratory failure. Hx of Alz dementia, DM, neuropathy, CKD, TIA, 2* AV block, recurrent syncope, CAD    PT Comments  Pt agreeable to mobilizing with therapy. He is mildly irritable-ready to go home. Called patient's daughter for him at his request so he could speak with her. Per chart review, pt set to d/c home later today.     Assistance Recommended at Discharge Intermittent Supervision/Assistance  If plan is discharge home, recommend the following:  Can travel by private vehicle    Assistance with cooking/housework;Assist for transportation;Help with stairs or ramp for entrance;Direct supervision/assist for medications management;Direct supervision/assist for financial management      Equipment Recommendations  None recommended by PT    Recommendations for Other Services       Precautions / Restrictions Precautions Precautions: Fall Restrictions Weight Bearing Restrictions: No     Mobility  Bed Mobility Overal bed mobility: Modified Independent                  Transfers Overall transfer level: Modified independent                      Ambulation/Gait Ambulation/Gait assistance: Supervision Gait Distance (Feet): 250 Feet Assistive device: Rollator (4 wheels) Gait Pattern/deviations: Step-through pattern, Decreased stride length       General Gait Details: Supv for safety, IV pole/lines. No LOB. O2 93% on RA. PT denied dizziness.   Stairs             Wheelchair Mobility     Tilt Bed    Modified Rankin (Stroke Patients Only)       Balance Overall balance assessment: Needs assistance           Standing balance-Leahy Scale: Fair                              Cognition Arousal/Alertness: Awake/alert Behavior During Therapy:  WFL for tasks assessed/performed Overall Cognitive Status: History of cognitive impairments - at baseline                                          Exercises      General Comments        Pertinent Vitals/Pain Pain Assessment Pain Assessment: No/denies pain    Home Living                          Prior Function            PT Goals (current goals can now be found in the care plan section) Progress towards PT goals: Progressing toward goals    Frequency    Min 1X/week      PT Plan Current plan remains appropriate    Co-evaluation              AM-PAC PT "6 Clicks" Mobility   Outcome Measure  Help needed turning from your back to your side while in a flat bed without using bedrails?: None Help needed moving from lying on your back to sitting on the side of a flat bed without using bedrails?: None Help needed moving to and from a  bed to a chair (including a wheelchair)?: None Help needed standing up from a chair using your arms (e.g., wheelchair or bedside chair)?: None Help needed to walk in hospital room?: A Little Help needed climbing 3-5 steps with a railing? : A Little 6 Click Score: 22    End of Session Equipment Utilized During Treatment: Gait belt Activity Tolerance: Patient tolerated treatment well Patient left: in bed;with call bell/phone within reach;with bed alarm set         Time: 0955-1006 PT Time Calculation (min) (ACUTE ONLY): 11 min  Charges:    $Gait Training: 8-22 mins PT General Charges $$ ACUTE PT VISIT: 1 Visit                        Faye Ramsay, PT Acute Rehabilitation  Office: [phone]

## 2023-01-29 NOTE — Discharge Summary (Signed)
Physician Discharge Summary  Philip Schultz ZOX:[id number] DOB: [birth year] DOA: 01/25/2023  PCP: Selina Cooley, MD  Admit date: 01/25/2023 Discharge date: 01/29/2023  Admitted From: Home  Discharge disposition: Home with home health  Recommendations for Outpatient Follow-Up:   Follow up with your primary care provider in one week.  Check CBC, BMP, magnesium in the next visit   Discharge Diagnosis:   Principal Problem:   Aspiration pneumonitis (HCC) Active Problems:   Type 2 diabetes mellitus with kidney complication, without long-term current use of insulin (HCC)   Discharge Condition: Improved.  Diet recommendation:  Regular.  Wound care: None.  Code status: Full.   History of Present Illness:   Patient is 89 year old with past medical history of type 2 diabetes, significant dementia presented to hospital with shortness of breath confusion. In the ED, patient was noted to be hypoxic with pulse ox of 84% on room air with wet cough and rhonchi. Patient was placed on oxygen and chest x-ray showed concerns for aspiration. Patient was started on IV antibiotics and was admitted hospital for further evaluation and treatment.    Hospital Course:   Following conditions were addressed during hospitalization as listed below,  Acute hypoxemic respiratory failure in the setting of aspiration pneumonia:  Resolved.  Patient  was initially on 4 L of oxygen by nasal cannula and was gradually weaned to room air.  Has remained stable since yesterday on room air.  Asymptomatic without any dyspnea or shortness of breath.  Afebrile without leukocytosis.  Will continue to wean as able.  Speech therapy has seen the patient and patient is a high risk for aspiration.  Patient received Unasyn during hospitalization and will be continued on Augmentin for next 2 days to complete 5-day course..    Bradycardia: EKG showed first-degree AV block.  TSH within normal limits.  Asymptomatic.     Type 2  diabetes with CKD without long-term current use of insulin: Latest hemoglobin A1c 7.2%.  On metformin at home.  CKD stage IIIb: Creatinine today at 1.3.  At baseline.     Alzheimer's dementia: Continue home medications Aricept, Seroquel   Hypertension: Continue amlodipine   Hyperlipidemia: Continue pravastatin   Diabetic neuropathy: Continue gabapentin   Normocytic anemia: No mention of bleeding.  Latest hemoglobin of 10.8.  Outpatient monitoring with PCP.   Thrombocytopenia: Platelet: 137--> 119>127.  No sign of active bleeding.  Will need outpatient monitoring with PCP.   BPH: Continue alfuzosin    Generalized weakness, fatigue.  Patient has been seen by physical therapy and recommend home health on discharge.  Disposition.  At this time, patient is stable for disposition home with outpatient PCP follow-up.  Spoke with the patient's daughter on the phone 01/28/2023  Medical Consultants:   None.  Procedures:    None Subjective:   Today, patient was seen and examined at bedside.  Denies any cough, shortness of breath, dyspnea, sputum production or chest pain.  Has been eating without any difficulty.  Wishes to go home.  No mention of fever chills or rigor.  Discharge Exam:   Vitals:   01/28/23 2011 01/29/23 0624  BP: (!) 146/80 (!) 145/67  Pulse: 63 62  Resp: 17 16  Temp: 98.9 F (37.2 C) 99.1 F (37.3 C)  SpO2: 97% 94%   Vitals:   01/28/23 0615 01/28/23 1530 01/28/23 2011 01/29/23 0624  BP: 135/70 (!) 150/79 (!) 146/80 (!) 145/67  Pulse: 60 66 63 62  Resp:  20 17 16  Temp: 99.1 F (37.3 C) 98.7 F (37.1 C) 98.9 F (37.2 C) 99.1 F (37.3 C)  TempSrc: Oral Oral Oral Oral  SpO2:  99% 97% 94%  Weight:      Height:       Body mass index is 23.06 kg/m.  General: Alert awake, not in obvious distress HENT: pupils equally reacting to light,  No scleral pallor or icterus noted. Oral mucosa is moist.  Chest:.  Diminished breath sounds bilaterally.  CVS: S1 &S2  heard. No murmur.  Regular rate and rhythm. Abdomen: Soft, nontender, nondistended.  Bowel sounds are heard.   Extremities: No cyanosis, clubbing or edema.  Peripheral pulses are palpable. Psych: Alert, awake and Communicative, CNS:  No cranial nerve deficits.  Power equal in all extremities.   Skin: Warm and dry.  No rashes noted.  The results of significant diagnostics from this hospitalization (including imaging, microbiology, ancillary and laboratory) are listed below for reference.     Diagnostic Studies:   DG Chest Portable 1 View  Result Date: 01/25/2023 CLINICAL DATA:  Hypoxia. EXAM: PORTABLE CHEST 1 VIEW COMPARISON:  06/19/2022. FINDINGS: Low lung volumes accentuate the pulmonary vasculature and cardiomediastinal silhouette. Reticular opacities in the lung bases, favored to reflect aspiration. No pleural effusion or pneumothorax. IMPRESSION: Reticular opacities in the lung bases, favored to reflect aspiration. Electronically Signed   By: Orvan Falconer M.D.   On: 01/25/2023 12:08   Labs:   Basic Metabolic Panel: Recent Labs  Lab 01/25/23 1132 01/25/23 1158 01/25/23 1749 01/26/23 0353 01/26/23 0354 01/27/23 0412 01/28/23 0340 01/29/23 0406  NA 132* 137  --   --  138 141 141 138  K 4.1 4.2  --   --  4.1 3.7 4.1 3.5  CL 100  --   --   --  107 109 108 107  CO2 22  --   --   --  24 25 24 23   GLUCOSE 287*  --   --   --  127* 115* 115* 116*  BUN 30*  --   --   --  34* 27* 18 12  CREATININE 1.80*  --  1.92*  --  1.98* 1.69* 1.35* 1.33*  CALCIUM 9.0  --   --   --  8.6* 8.3* 8.3* 7.9*  MG  --   --   --  1.8  --   --   --   --    GFR Estimated Creatinine Clearance: 41.1 mL/min (A) (by C-G formula based on SCr of 1.33 mg/dL (H)). Liver Function Tests: Recent Labs  Lab 01/25/23 1132  AST 15  ALT 10  ALKPHOS 49  BILITOT 0.4  PROT 7.1  ALBUMIN 3.3*   No results for input(s): "LIPASE", "AMYLASE" in the last 168 hours. No results for input(s): "AMMONIA" in the last 168  hours. Coagulation profile Recent Labs  Lab 01/26/23 0354  INR 1.2    CBC: Recent Labs  Lab 01/25/23 1132 01/25/23 1158 01/25/23 1851 01/26/23 0354 01/27/23 0412 01/28/23 0340 01/29/23 0406  WBC 4.7  --  5.3 6.1 3.5* 3.5* 3.3*  NEUTROABS 3.4  --   --   --   --   --   --   HGB 13.3   < > 11.8* 10.4* 9.9* 10.8* 10.4*  HCT 38.0*   < > 34.5* 30.9* 30.0* 32.3* 30.0*  MCV 86.6  --  89.4 90.9 90.6 89.5 88.0  PLT 166  --  143* 137* 119* 127* 138*   < > =  values in this interval not displayed.   Cardiac Enzymes: No results for input(s): "CKTOTAL", "CKMB", "CKMBINDEX", "TROPONINI" in the last 168 hours. BNP: Invalid input(s): "POCBNP" CBG: Recent Labs  Lab 01/28/23 1150 01/28/23 1634 01/28/23 2018 01/28/23 2318 01/29/23 0801  GLUCAP 247* 76 204* 118* 105*   D-Dimer No results for input(s): "DDIMER" in the last 72 hours. Hgb A1c No results for input(s): "HGBA1C" in the last 72 hours. Lipid Profile No results for input(s): "CHOL", "HDL", "LDLCALC", "TRIG", "CHOLHDL", "LDLDIRECT" in the last 72 hours. Thyroid function studies Recent Labs    01/26/23 1026  TSH 0.545   Anemia work up No results for input(s): "VITAMINB12", "FOLATE", "FERRITIN", "TIBC", "IRON", "RETICCTPCT" in the last 72 hours. Microbiology Recent Results (from the past 240 hour(s))  MRSA Next Gen by PCR, Nasal     Status: Abnormal   Collection Time: 01/25/23  8:38 PM   Specimen: Nasal Mucosa; Nasal Swab  Result Value Ref Range Status   MRSA by PCR Next Gen DETECTED (A) NOT DETECTED Final    Comment: (NOTE) The GeneXpert MRSA Assay (FDA approved for NASAL specimens only), is one component of a comprehensive MRSA colonization surveillance program. It is not intended to diagnose MRSA infection nor to guide or monitor treatment for MRSA infections. Test performance is not FDA approved in patients less than 47 years old. Performed at [facility], [address] [place], [place]  [address]      Discharge Instructions:   Discharge Instructions     Call MD for:  difficulty breathing, headache or visual disturbances   Complete by: As directed    Call MD for:  temperature >100.4   Complete by: As directed    Diet general   Complete by: As directed    Discharge instructions   Complete by: As directed    Follow-up with your primary care provider in 1 week.  Take aspiration precautions.  Complete course of antibiotic.  Seek medical attention for worsening symptoms.   Increase activity slowly   Complete by: As directed       Allergies as of 01/29/2023   No Known Allergies      Medication List     TAKE these medications    albuterol 108 (90 Base) MCG/ACT inhaler Commonly known as: VENTOLIN HFA Inhale 2 puffs into the lungs every 6 (six) hours as needed for wheezing or shortness of breath.   alfuzosin 10 MG 24 hr tablet Commonly known as: UROXATRAL Take 10 mg by mouth daily.   amLODipine 5 MG tablet Commonly known as: NORVASC Take 5 mg by mouth daily.   amoxicillin-clavulanate 875-125 MG tablet Commonly known as: AUGMENTIN Take 1 tablet by mouth 2 (two) times daily for 2 days.   aspirin EC 81 MG tablet Take 81 mg by mouth daily.   donepezil 10 MG tablet Commonly known as: ARICEPT Take 10 mg by mouth at bedtime.   DSS 100 MG Caps Take 100 mg by mouth 2 (two) times daily.   gabapentin 300 MG capsule Commonly known as: NEURONTIN Take 900 mg by mouth at bedtime.   lovastatin 40 MG tablet Commonly known as: MEVACOR Take 40 mg by mouth at bedtime.   metFORMIN 500 MG tablet Commonly known as: GLUCOPHAGE Take 500 mg by mouth daily.   QUEtiapine 25 MG tablet Commonly known as: SEROQUEL Take 25 mg by mouth daily.   QUEtiapine 100 MG tablet Commonly known as: SEROQUEL Take 100 mg by mouth at bedtime.  Follow-up Information     Selina Cooley, MD Follow up in 1 week(s).   Specialty: Family Medicine Contact information: [phone]  [facility] [address] [address] [place] [address] [phone]                  Time coordinating discharge: 39 minutes  Signed:  Damarious Holtsclaw  [facility] 01/29/2023, 8:58 AM

## 2023-11-08 ENCOUNTER — Emergency Department (HOSPITAL_BASED_OUTPATIENT_CLINIC_OR_DEPARTMENT_OTHER)

## 2023-11-08 ENCOUNTER — Encounter (HOSPITAL_BASED_OUTPATIENT_CLINIC_OR_DEPARTMENT_OTHER): Payer: Self-pay | Admitting: Emergency Medicine

## 2023-11-08 ENCOUNTER — Emergency Department (HOSPITAL_BASED_OUTPATIENT_CLINIC_OR_DEPARTMENT_OTHER): Admission: EM | Admit: 2023-11-08 | Discharge: 2023-11-08 | Disposition: A | Discharge status: Home / Self Care

## 2023-11-08 ENCOUNTER — Other Ambulatory Visit: Payer: Self-pay

## 2023-11-08 DIAGNOSIS — Z7982 Long term (current) use of aspirin: Secondary | ICD-10-CM | POA: Diagnosis not present

## 2023-11-08 DIAGNOSIS — Z79899 Other long term (current) drug therapy: Secondary | ICD-10-CM | POA: Insufficient documentation

## 2023-11-08 DIAGNOSIS — W19XXXA Unspecified fall, initial encounter: Secondary | ICD-10-CM

## 2023-11-08 DIAGNOSIS — G309 Alzheimer's disease, unspecified: Secondary | ICD-10-CM | POA: Diagnosis not present

## 2023-11-08 DIAGNOSIS — W010XXA Fall on same level from slipping, tripping and stumbling without subsequent striking against object, initial encounter: Secondary | ICD-10-CM | POA: Diagnosis not present

## 2023-11-08 DIAGNOSIS — N189 Chronic kidney disease, unspecified: Secondary | ICD-10-CM | POA: Diagnosis not present

## 2023-11-08 DIAGNOSIS — S51011A Laceration without foreign body of right elbow, initial encounter: Secondary | ICD-10-CM | POA: Diagnosis not present

## 2023-11-08 DIAGNOSIS — I129 Hypertensive chronic kidney disease with stage 1 through stage 4 chronic kidney disease, or unspecified chronic kidney disease: Secondary | ICD-10-CM | POA: Diagnosis not present

## 2023-11-08 DIAGNOSIS — S81811A Laceration without foreign body, right lower leg, initial encounter: Secondary | ICD-10-CM | POA: Diagnosis present

## 2023-11-08 DIAGNOSIS — F0280 Dementia in other diseases classified elsewhere without behavioral disturbance: Secondary | ICD-10-CM | POA: Diagnosis not present

## 2023-11-08 MED ORDER — LIDOCAINE-EPINEPHRINE (PF) 2 %-1:200000 IJ SOLN
20.0000 mL | Freq: Once | INTRAMUSCULAR | Status: DC
  Filled 2023-11-08: qty 20

## 2023-11-08 MED ORDER — CEFADROXIL 500 MG PO CAPS: 500.0000 mg | ORAL_CAPSULE | Freq: Two times a day (BID) | ORAL | 0 refills | Status: AC

## 2023-11-08 MED ORDER — HYDROCODONE-ACETAMINOPHEN 5-325 MG PO TABS
1.0000 | ORAL_TABLET | Freq: Once | ORAL | Status: AC
  Administered 2023-11-08: 1 via ORAL
  Filled 2023-11-08: qty 1

## 2023-11-08 MED ORDER — ACETAMINOPHEN 500 MG PO TABS
500.0000 mg | ORAL_TABLET | Freq: Once | ORAL | Status: AC
  Administered 2023-11-08: 500 mg via ORAL
  Filled 2023-11-08: qty 1

## 2023-11-08 NOTE — ED Notes (Signed)
 Non-adherent dressing applied to right lower leg laceration.

## 2023-11-08 NOTE — ED Provider Notes (Signed)
 [facility] Provider Note   CSN: [id number] Arrival date & time: 11/08/23  1612     History {Add pertinent medical, surgical, social history, OB history to HPI:1} Chief Complaint  Patient presents with  . Laceration    Philip Schultz is a 89 y.o. male.   Laceration   89 year old male presents emergency department with complaints of fall.  Fall occurred around 430 this morning.  Lives at assisted living facility.  States that Philip Schultz was try to get something out of his mini fridge which is on the table.  States that Philip Schultz tried to get the door open and accidentally pulled the mini fridge off of the table causing him to fall backwards.  Does report striking the left upper part of his head on the ground.  Denies LOC, blood thinner use.  Did suffer a laceration to his right lower leg from the fridge landing on it.  Had it bandaged at the assisted living facility but due to continued bleeding and after being assessed by an NP, recommended coming to the emergency department because Philip Schultz may need sutures.  Patient has been ambulating since then with pain to his right lower extremity.  Denies any chest pain, shortness of breath, abdominal pain, nausea, vomiting, visual disturbance, gait abnormality from baseline.  Past medical history significant for BPH, Alzheimer's dementia, hypertension, GERD, LVH, aortic valve sclerosis, arrhythmias, TIA, CKD  Home Medications Prior to Admission medications   Medication Sig Start Date End Date Taking? Authorizing Provider  albuterol  (VENTOLIN  HFA) 108 (90 Base) MCG/ACT inhaler Inhale 2 puffs into the lungs every 6 (six) hours as needed for wheezing or shortness of breath. 01/29/23   Pokhrel, Laxman, MD  alfuzosin  (UROXATRAL ) 10 MG 24 hr tablet Take 10 mg by mouth daily. 02/28/22   [provider]  amLODipine  (NORVASC ) 5 MG tablet Take 5 mg by mouth daily. 02/21/22   [provider]  aspirin  EC 81 MG tablet  Take 81 mg by mouth daily.    [provider]  Docusate Sodium  (DSS) 100 MG CAPS Take 100 mg by mouth 2 (two) times daily.    [provider]  donepezil  (ARICEPT ) 10 MG tablet Take 10 mg by mouth at bedtime. 07/30/22   [provider]  gabapentin  (NEURONTIN ) 300 MG capsule Take 900 mg by mouth at bedtime. 06/06/22   [provider]  lovastatin (MEVACOR) 40 MG tablet Take 40 mg by mouth at bedtime. 08/07/22   [provider]  metFORMIN (GLUCOPHAGE) 500 MG tablet Take 500 mg by mouth daily. 06/18/22   [provider]  QUEtiapine  (SEROQUEL ) 100 MG tablet Take 100 mg by mouth at bedtime. 08/20/22   [provider]  QUEtiapine  (SEROQUEL ) 25 MG tablet Take 25 mg by mouth daily.    [provider]      Allergies    Patient has no known allergies.    Review of Systems   Review of Systems  All other systems reviewed and are negative.   Physical Exam Updated Vital Signs BP 103/60 (BP Location: Right Arm)   Pulse 85   Temp (!) 97.5 F (36.4 C) (Oral)   Resp 16   SpO2 94%  Physical Exam Vitals and nursing note reviewed.  Constitutional:      General: Philip Schultz is not in acute distress.    Appearance: Philip Schultz is well-developed.  HENT:     Head: Normocephalic and atraumatic.  Eyes:     Conjunctiva/sclera:  Conjunctivae normal.  Cardiovascular:     Rate and Rhythm: Normal rate and regular rhythm.     Heart sounds: No murmur heard. Pulmonary:     Effort: Pulmonary effort is normal. No respiratory distress.     Breath sounds: Normal breath sounds.  Abdominal:     Palpations: Abdomen is soft.     Tenderness: There is no abdominal tenderness.  Musculoskeletal:        General: No swelling.     Cervical back: Neck supple.     Comments: Tender to palpation lateral malleolus right ankle.  No chest wall tenderness.  No midline tenderness cervical thoracic or lumbar spine.  No tenderness of upper extremities.  Otherwise, no tenderness of  lower extremities besides aforementioned.  Skin:    General: Skin is warm and dry.     Capillary Refill: Capillary refill takes less than 2 seconds.     Comments: Superficial abrasion appreciated posterior aspect of right elbow.  Additional abrasion appreciated on superior left aspect of scalp.  16.5 cm annular shaped laceration appreciated right lower extremity just proximal to ankle.  Active bleeding.  Steri-Strips in place holding skin flap superiorly.  Neurological:     Mental Status: Philip Schultz is alert.     Comments: Alert and oriented to self, place, and event.   Speech is fluent, clear without dysarthria or dysphasia.   Strength symmetric in upper/lower extremities  Sensation intact in upper/lower extremities   Gait not assessed initially CN I not tested  CN II not tested  CN III, IV, VI PERRLA and EOMs intact bilaterally  CN V Intact sensation to sharp and light touch to the face  CN VII facial movements symmetric  CN VIII not tested  CN IX, X no uvula deviation, symmetric rise of soft palate  CN XI symmetric SCM and trapezius strength bilaterally  CN XII Midline tongue protrusion, symmetric L/R movements     Psychiatric:        Mood and Affect: Mood normal.    ED Results / Procedures / Treatments   Labs (all labs ordered are listed, but only abnormal results are displayed) Labs Reviewed - No data to display  EKG None  Radiology No results found.  Procedures Procedures  {Document cardiac monitor, telemetry assessment procedure when appropriate:1}  Medications Ordered in ED Medications  lidocaine -EPINEPHrine (XYLOCAINE W/EPI) 2 %-1:200000 (PF) injection 20 mL (has no administration in time range)  HYDROcodone -acetaminophen  (NORCO/VICODIN) 5-325 MG per tablet 1 tablet (1 tablet Oral Given 11/08/23 1638)    ED Course/ Medical Decision Making/ A&P   {   Click here for ABCD2, HEART and other calculatorsREFRESH Note before signing :1}                               Medical Decision Making Amount and/or Complexity of Data Reviewed Radiology: ordered.  Risk Prescription drug management.   This patient presents to the ED for concern of fall, this involves an extensive number of treatment options, and is a complaint that carries with it a high risk of complications and morbidity.  The differential diagnosis includes CVA, fracture, strain/pain, dislocation, ligamentous/tendinous injury, neurovasc compromise, pneumothorax, solid organ damage, other   Co morbidities that complicate the patient evaluation  See HPI   Additional history obtained:  Additional history obtained from EMR External records from outside source obtained and reviewed including hospital records   Lab Tests:  N/a   Imaging Studies  ordered:  I ordered imaging studies including CT head/cervical spine, right ankle x-ray, right hip/hip x-ray I independently visualized and interpreted imaging which showed   CT head/cervical spine: No acute intracranial abnormality.  Chronic changes as above.  No acute fracture or traumatic subluxation cervical spine.  Degenerative changes as well as facet arthropathy Right ankle x-ray: *** Right tib/fib x-ray: I agree with the radiologist interpretation   Cardiac Monitoring: / EKG:  The patient was maintained on a cardiac monitor.  I personally viewed and interpreted the cardiac monitored which showed an underlying rhythm of: sinus rhythm   Consultations Obtained:  N/a   Problem List / ED Course / Critical interventions / Medication management  Fall, laceration I ordered medication including Norco, lidocaine  with epinephrine   Reevaluation of the patient after these medicines showed that the patient improved I have reviewed the patients home medicines and have made adjustments as needed   Social Determinants of Health:  Former cigarette use.  Denies illicit drug use.   Test / Admission - Considered:  Fall,  Laceration Vitals signs within normal range and stable throughout visit. Imaging studies significant for: see above 89 year old male presents emergency department with complaints of fall.  Fall occurred around 430 this morning.  Lives at assisted living facility.  States that Philip Schultz was try to get something out of his mini fridge which is on the table.  States that Philip Schultz tried to get the door open and accidentally pulled the mini fridge off of the table causing him to fall backwards.  Does report striking the left upper part of his head on the ground.  Denies LOC, blood thinner use.  Did suffer a laceration to his right lower leg from the fridge landing on it.  Had it bandaged at the assisted living facility but due to continued bleeding and after being assessed by an NP, recommended coming to the emergency department because Philip Schultz may need sutures.  Patient has been ambulating since then with pain to his right lower extremity.  Denies any chest pain, shortness of breath, abdominal pain, nausea, vomiting, visual disturbance, gait abnormality from baseline. *** Worrisome signs and symptoms were discussed with the patient, and the patient acknowledged understanding to return to the ED if noticed. Patient was stable upon discharge.    {Document critical care time when appropriate:1} {Document review of labs and clinical decision tools ie heart score, Chads2Vasc2 etc:1}  {Document your independent review of radiology images, and any outside records:1} {Document your discussion with family members, caretakers, and with consultants:1} {Document social determinants of health affecting pt's care:1} {Document your decision making why or why not admission, treatments were needed:1} Final Clinical Impression(s) / ED Diagnoses Final diagnoses:  None    Rx / DC Orders ED Discharge Orders     None

## 2023-11-08 NOTE — ED Triage Notes (Signed)
 Pt POV in personal wheelchair with daughter- hx of alzheimer's- daughter reports pts mini fridge fell and hit leg this AM.  Wound wrapped in dressing PTA.

## 2023-11-08 NOTE — Discharge Instructions (Signed)
 As discussed, CT imaging of head and neck appeared normal.  X-ray of your right lower leg was normal.  Your laceration was repaired using nonabsorbable stitches.  This should be removed in about a week's time.  The glue holding the skin tear together should dissolve in the next 5 to 7 days.  Recommend keeping area clean and covered.  Will place on antibiotics for prevention of infection.  Please not hesitate to return if the worrisome signs and symptoms we discussed become apparent.

## 2023-11-08 NOTE — ED Notes (Addendum)
 Called [facility] to give report to nursing staff. Spoke with Athena Bland. He is aware of all orders.
